# Patient Record
Sex: Female | Born: 1950 | Race: White | Hispanic: No | Marital: Married | Smoking: Never smoker
Health system: Southern US, Community
[De-identification: ages and names within clinical notes are randomized; demographics above are authoritative.]

## PROBLEM LIST (undated history)

## (undated) DIAGNOSIS — E039 Hypothyroidism, unspecified: Secondary | ICD-10-CM

## (undated) DIAGNOSIS — Z1239 Encounter for other screening for malignant neoplasm of breast: Secondary | ICD-10-CM

## (undated) DIAGNOSIS — K219 Gastro-esophageal reflux disease without esophagitis: Secondary | ICD-10-CM

## (undated) DIAGNOSIS — Z853 Personal history of malignant neoplasm of breast: Secondary | ICD-10-CM

## (undated) DIAGNOSIS — E349 Endocrine disorder, unspecified: Secondary | ICD-10-CM

## (undated) DIAGNOSIS — N3090 Cystitis, unspecified without hematuria: Secondary | ICD-10-CM

## (undated) DIAGNOSIS — E669 Obesity, unspecified: Secondary | ICD-10-CM

## (undated) DIAGNOSIS — Z1211 Encounter for screening for malignant neoplasm of colon: Secondary | ICD-10-CM

## (undated) DIAGNOSIS — C801 Malignant (primary) neoplasm, unspecified: Secondary | ICD-10-CM

## (undated) DIAGNOSIS — G620 Drug-induced polyneuropathy: Secondary | ICD-10-CM

## (undated) DIAGNOSIS — E119 Type 2 diabetes mellitus without complications: Secondary | ICD-10-CM

## (undated) DIAGNOSIS — D649 Anemia, unspecified: Secondary | ICD-10-CM

## (undated) HISTORY — DX: Obesity, unspecified: E66.9

## (undated) HISTORY — DX: Gastro-esophageal reflux disease without esophagitis: K21.9

## (undated) HISTORY — DX: Type 2 diabetes mellitus without complications: E11.9

## (undated) HISTORY — DX: Encounter for other screening for malignant neoplasm of breast: Z12.39

## (undated) HISTORY — DX: Personal history of malignant neoplasm of breast: Z85.3

## (undated) HISTORY — DX: Cystitis, unspecified without hematuria: N30.90

## (undated) HISTORY — DX: Drug-induced polyneuropathy: G62.0

## (undated) HISTORY — DX: Malignant (primary) neoplasm, unspecified: C80.1

## (undated) HISTORY — DX: Encounter for screening for malignant neoplasm of colon: Z12.11

## (undated) HISTORY — PX: PORTACATH PLACEMENT: SHX2246

## (undated) HISTORY — DX: Endocrine disorder, unspecified: E34.9

## (undated) HISTORY — PX: APPENDECTOMY: SHX54

---

## 1995-07-01 DIAGNOSIS — C801 Malignant (primary) neoplasm, unspecified: Secondary | ICD-10-CM

## 1995-07-01 HISTORY — PX: ABDOMINAL HYSTERECTOMY: SHX81

## 1995-07-01 HISTORY — DX: Malignant (primary) neoplasm, unspecified: C80.1

## 2004-06-30 DIAGNOSIS — K219 Gastro-esophageal reflux disease without esophagitis: Secondary | ICD-10-CM

## 2004-06-30 HISTORY — DX: Gastro-esophageal reflux disease without esophagitis: K21.9

## 2004-11-20 ENCOUNTER — Ambulatory Visit: Payer: Self-pay | Admitting: Unknown Physician Specialty

## 2005-12-22 ENCOUNTER — Ambulatory Visit: Payer: Self-pay | Admitting: Unknown Physician Specialty

## 2007-07-01 DIAGNOSIS — E349 Endocrine disorder, unspecified: Secondary | ICD-10-CM

## 2007-07-01 DIAGNOSIS — Z853 Personal history of malignant neoplasm of breast: Secondary | ICD-10-CM

## 2007-07-01 HISTORY — DX: Endocrine disorder, unspecified: E34.9

## 2007-07-01 HISTORY — DX: Personal history of malignant neoplasm of breast: Z85.3

## 2007-08-26 ENCOUNTER — Ambulatory Visit: Payer: Self-pay | Admitting: Unknown Physician Specialty

## 2007-08-29 ENCOUNTER — Ambulatory Visit: Payer: Self-pay | Admitting: Oncology

## 2007-09-14 ENCOUNTER — Ambulatory Visit: Payer: Self-pay | Admitting: General Surgery

## 2007-09-15 ENCOUNTER — Ambulatory Visit: Payer: Self-pay | Admitting: General Surgery

## 2007-09-22 ENCOUNTER — Ambulatory Visit: Payer: Self-pay | Admitting: Oncology

## 2007-09-29 ENCOUNTER — Ambulatory Visit: Payer: Self-pay | Admitting: Oncology

## 2007-10-01 ENCOUNTER — Ambulatory Visit: Payer: Self-pay | Admitting: General Surgery

## 2007-10-29 ENCOUNTER — Ambulatory Visit: Payer: Self-pay | Admitting: Oncology

## 2007-11-29 ENCOUNTER — Ambulatory Visit: Payer: Self-pay | Admitting: Oncology

## 2007-12-29 ENCOUNTER — Ambulatory Visit: Payer: Self-pay | Admitting: Oncology

## 2008-01-29 ENCOUNTER — Ambulatory Visit: Payer: Self-pay | Admitting: Oncology

## 2008-02-29 ENCOUNTER — Ambulatory Visit: Payer: Self-pay | Admitting: Radiation Oncology

## 2008-02-29 ENCOUNTER — Ambulatory Visit: Payer: Self-pay | Admitting: Oncology

## 2008-03-30 ENCOUNTER — Ambulatory Visit: Payer: Self-pay | Admitting: Oncology

## 2008-04-30 ENCOUNTER — Ambulatory Visit: Payer: Self-pay | Admitting: Oncology

## 2008-05-30 ENCOUNTER — Ambulatory Visit: Payer: Self-pay | Admitting: Oncology

## 2008-06-30 ENCOUNTER — Ambulatory Visit: Payer: Self-pay | Admitting: Oncology

## 2008-07-31 ENCOUNTER — Ambulatory Visit: Payer: Self-pay | Admitting: Oncology

## 2008-08-28 ENCOUNTER — Ambulatory Visit: Payer: Self-pay | Admitting: General Surgery

## 2008-08-28 ENCOUNTER — Ambulatory Visit: Payer: Self-pay | Admitting: Oncology

## 2008-09-28 ENCOUNTER — Ambulatory Visit: Payer: Self-pay | Admitting: Oncology

## 2008-10-04 ENCOUNTER — Ambulatory Visit: Payer: Self-pay | Admitting: Oncology

## 2008-10-28 ENCOUNTER — Ambulatory Visit: Payer: Self-pay | Admitting: Oncology

## 2008-11-28 ENCOUNTER — Ambulatory Visit: Payer: Self-pay | Admitting: Oncology

## 2009-01-05 ENCOUNTER — Ambulatory Visit: Payer: Self-pay | Admitting: Oncology

## 2009-01-28 ENCOUNTER — Ambulatory Visit: Payer: Self-pay | Admitting: Oncology

## 2009-02-28 ENCOUNTER — Ambulatory Visit: Payer: Self-pay | Admitting: Oncology

## 2009-03-30 ENCOUNTER — Ambulatory Visit: Payer: Self-pay | Admitting: Oncology

## 2009-04-12 ENCOUNTER — Ambulatory Visit: Payer: Self-pay | Admitting: Oncology

## 2009-04-30 ENCOUNTER — Ambulatory Visit: Payer: Self-pay | Admitting: Oncology

## 2009-05-30 ENCOUNTER — Ambulatory Visit: Payer: Self-pay | Admitting: Oncology

## 2009-06-30 ENCOUNTER — Ambulatory Visit: Payer: Self-pay | Admitting: Oncology

## 2009-06-30 HISTORY — PX: COLONOSCOPY: SHX174

## 2009-06-30 HISTORY — PX: UPPER GI ENDOSCOPY: SHX6162

## 2009-08-13 ENCOUNTER — Ambulatory Visit: Payer: Self-pay | Admitting: Oncology

## 2009-08-28 ENCOUNTER — Ambulatory Visit: Payer: Self-pay | Admitting: Oncology

## 2009-09-19 ENCOUNTER — Ambulatory Visit: Payer: Self-pay | Admitting: General Surgery

## 2009-10-01 ENCOUNTER — Ambulatory Visit: Payer: Self-pay | Admitting: Oncology

## 2009-10-12 ENCOUNTER — Ambulatory Visit: Payer: Self-pay | Admitting: Unknown Physician Specialty

## 2009-10-28 ENCOUNTER — Ambulatory Visit: Payer: Self-pay | Admitting: Oncology

## 2009-11-28 ENCOUNTER — Ambulatory Visit: Payer: Self-pay | Admitting: Oncology

## 2009-12-03 ENCOUNTER — Ambulatory Visit: Payer: Self-pay | Admitting: Oncology

## 2009-12-28 ENCOUNTER — Ambulatory Visit: Payer: Self-pay | Admitting: Oncology

## 2010-01-31 ENCOUNTER — Ambulatory Visit: Payer: Self-pay | Admitting: Oncology

## 2010-02-28 ENCOUNTER — Ambulatory Visit: Payer: Self-pay | Admitting: Oncology

## 2010-04-01 ENCOUNTER — Ambulatory Visit: Payer: Self-pay | Admitting: General Surgery

## 2010-04-25 ENCOUNTER — Ambulatory Visit: Payer: Self-pay | Admitting: Oncology

## 2010-04-30 ENCOUNTER — Ambulatory Visit: Payer: Self-pay | Admitting: Oncology

## 2010-06-30 HISTORY — PX: PORT-A-CATH REMOVAL: SHX5289

## 2010-07-04 ENCOUNTER — Ambulatory Visit: Payer: Self-pay | Admitting: Oncology

## 2010-07-05 LAB — CANCER ANTIGEN 27.29: CA 27.29: 29.8 U/mL (ref 0.0–38.6)

## 2010-07-31 ENCOUNTER — Ambulatory Visit: Payer: Self-pay | Admitting: Oncology

## 2010-09-12 ENCOUNTER — Ambulatory Visit: Payer: Self-pay | Admitting: Oncology

## 2010-09-29 ENCOUNTER — Ambulatory Visit: Payer: Self-pay | Admitting: Oncology

## 2010-10-08 ENCOUNTER — Ambulatory Visit: Payer: Self-pay | Admitting: General Surgery

## 2011-01-16 ENCOUNTER — Ambulatory Visit: Payer: Self-pay | Admitting: Oncology

## 2011-01-17 LAB — CANCER ANTIGEN 27.29: CA 27.29: 35.5 U/mL (ref 0.0–38.6)

## 2011-01-29 ENCOUNTER — Ambulatory Visit: Payer: Self-pay | Admitting: Oncology

## 2011-07-17 ENCOUNTER — Ambulatory Visit: Payer: Self-pay | Admitting: Oncology

## 2011-07-17 LAB — COMPREHENSIVE METABOLIC PANEL
Albumin: 4.2 g/dL (ref 3.4–5.0)
Alkaline Phosphatase: 77 U/L (ref 50–136)
BUN: 10 mg/dL (ref 7–18)
Bilirubin,Total: 0.8 mg/dL (ref 0.2–1.0)
Calcium, Total: 9.1 mg/dL (ref 8.5–10.1)
Co2: 28 mmol/L (ref 21–32)
Creatinine: 0.81 mg/dL (ref 0.60–1.30)
Glucose: 92 mg/dL (ref 65–99)
SGOT(AST): 25 U/L (ref 15–37)
Sodium: 144 mmol/L (ref 136–145)

## 2011-07-17 LAB — CBC CANCER CENTER
Basophil %: 0.5 %
Eosinophil #: 0.1 x10 3/mm (ref 0.0–0.7)
Eosinophil %: 1.3 %
HCT: 34.2 % — ABNORMAL LOW (ref 35.0–47.0)
HGB: 11.7 g/dL — ABNORMAL LOW (ref 12.0–16.0)
Lymphocyte #: 1.6 x10 3/mm (ref 1.0–3.6)
MCH: 31.1 pg (ref 26.0–34.0)
MCV: 91 fL (ref 80–100)
Monocyte #: 0.4 x10 3/mm (ref 0.0–0.7)
Monocyte %: 7.7 %
Platelet: 223 x10 3/mm (ref 150–440)
RBC: 3.77 10*6/uL — ABNORMAL LOW (ref 3.80–5.20)
WBC: 5.5 x10 3/mm (ref 3.6–11.0)

## 2011-08-01 ENCOUNTER — Ambulatory Visit: Payer: Self-pay | Admitting: Oncology

## 2011-10-13 ENCOUNTER — Ambulatory Visit: Payer: Self-pay | Admitting: General Surgery

## 2012-04-08 ENCOUNTER — Ambulatory Visit: Payer: Self-pay | Admitting: Oncology

## 2012-04-30 ENCOUNTER — Ambulatory Visit: Payer: Self-pay | Admitting: Oncology

## 2012-08-21 ENCOUNTER — Encounter: Payer: Self-pay | Admitting: General Surgery

## 2012-10-06 ENCOUNTER — Ambulatory Visit: Payer: Self-pay | Admitting: Oncology

## 2012-10-13 ENCOUNTER — Ambulatory Visit: Payer: Self-pay | Admitting: General Surgery

## 2012-10-18 ENCOUNTER — Encounter: Payer: Self-pay | Admitting: General Surgery

## 2012-10-28 ENCOUNTER — Ambulatory Visit: Payer: Self-pay | Admitting: Oncology

## 2012-11-08 ENCOUNTER — Ambulatory Visit: Payer: Self-pay | Admitting: General Surgery

## 2013-05-23 ENCOUNTER — Ambulatory Visit: Payer: Self-pay | Admitting: General Surgery

## 2013-06-16 ENCOUNTER — Encounter: Payer: Self-pay | Admitting: General Surgery

## 2013-07-04 ENCOUNTER — Ambulatory Visit (INDEPENDENT_AMBULATORY_CARE_PROVIDER_SITE_OTHER): Payer: 59 | Admitting: General Surgery

## 2013-07-04 ENCOUNTER — Encounter: Payer: Self-pay | Admitting: General Surgery

## 2013-07-04 VITALS — BP 140/70 | HR 78 | Resp 14 | Ht 65.0 in | Wt 185.0 lb

## 2013-07-04 DIAGNOSIS — Z1501 Genetic susceptibility to malignant neoplasm of breast: Secondary | ICD-10-CM

## 2013-07-04 DIAGNOSIS — Z853 Personal history of malignant neoplasm of breast: Secondary | ICD-10-CM

## 2013-07-04 DIAGNOSIS — Z1509 Genetic susceptibility to other malignant neoplasm: Secondary | ICD-10-CM

## 2013-07-04 NOTE — Patient Instructions (Signed)
Mastectomy, With or Without Reconstruction °Mastectomy (removal of the breast) is a procedure most commonly used to treat cancer (tumor) of the breast. Different procedures are available for treatment. This depends on the stage of the tumor (abnormal growths). Discuss this with your caregiver, surgeon (a specialist for performing operations such as this), or oncologist (someone specialized in the treatment of cancer). With proper information, you can decide which treatment is best for you. Although the sound of the word cancer is frightening to all of us, the new treatments and medications can be a source of reassurance and comfort. If there are things you are worried about, discuss them with your caregiver. He or she can help comfort you and your family. Some of the different procedures for treating breast cancer are: °· Radical (extensive) mastectomy. This is an operation used to remove the entire breast, the muscles under the breast, and all of the glands (lymph nodes) under the arm. With all of the new treatments available for cancer of the breast, this procedure has become less common. °· Modified radical mastectomy. This is a similar operation to the radical mastectomy described above. In the modified radical mastectomy, the muscles of the chest wall are not removed unless one of the lessor muscles is removed. One of the lessor muscles may be removed to allow better removal of the lymph nodes. The axillary lymph nodes are also removed. Rarely, during an axillary node dissection nerves to this area are damaged. Radiation therapy is then often used to the area following this surgery. °· A total mastectomy also known as a complete or simple mastectomy. It involves removal of only the breast. The lymph nodes and the muscles are left in place. °· In a lumpectomy, the lump is removed from the breast. This is the simplest form of surgical treatment. A sentinel lymph node biopsy may also be done. Additional treatment  may be required. °RISKS AND COMPLICATIONS °The main problems that follow removal of the breast include: °· Infection (germs start growing in the wound). This can usually be treated with antibiotics (medications that kill germs). °· Lymphedema. This means the arm on the side of the breast that was operated on swells because the lymph (tissue fluid) cannot follow the main channels back into the body. This only occurs when the lymph nodes have had to be removed under the arm. °· There may be some areas of numbness to the upper arm and around the incision (cut by the surgeon) in the breast. This happens because of the cutting of or damage to some of the nerves in the area. This is most often unavoidable. °· There may be difficulty moving the arm in a full range of motion (moving in all directions) following surgery. This usually improves with time following use and exercise. °· Recurrence of breast cancer may happen with the very best of surgery and follow up treatment. Sometimes small cancer cells that cannot be seen with the naked eye have already spread at the time of surgery. When this happens other treatment is available. This treatment may be radiation, medications or a combination of both. °RECONSTRUCTION °Reconstruction of the breast may be done immediately if there is not going to be post-operative radiation. This surgery is done for cosmetic (improve appearance) purposes to improve the physical appearance after the operation. This may be done in two ways: °· It can be done using a saline filled prosthetic (an artificial breast which is filled with salt water). Silicone breast implants are now   re-approved by the FDA and are being commonly used.  Reconstruction can be done using the body's own muscle/fat/skin. Your caregiver will discuss your options with you. Depending upon your needs or choice, together you will be able to determine which procedure is best for you. Document Released: 03/11/2001 Document  Revised: 03/10/2012 Document Reviewed: 11/02/2007 [facility] Patient Information 2014 [place].

## 2013-07-04 NOTE — Progress Notes (Addendum)
Patient ID: Morgan Flores, female   DOB: 03-20-1951, 63 y.o.   MRN: [id number]  Chief Complaint  Patient presents with  . Other    discuss mastectomy    HPI Morgan Flores is a 63 y.o. female referred here by Dr Cristino Martes due to posititve BRCA 1 testing. Patient is also due to have bilateral oophorectomy surgery and wanted to coordinate this with a bilateral mastectomy. Her last mammogram was 09/2012 Cat 2.    HPI  Past Medical History  Diagnosis Date  . Endocrine problem 2009    thyroid  . Personal history of malignant neoplasm of breast 2009    right breast  . Breast screening, unspecified   . Special screening for malignant neoplasms, colon   . Obesity, unspecified   . Hiatal hernia 2011  . Bladder infection   . Acid reflux 2006  . Cancer 1997    cervical    Past Surgical History  Procedure Laterality Date  . Portacath placement  2009  . Cesarean section  1983  . Abdominal hysterectomy  1997  . Port-a-cath removal  2012  . Colonoscopy  2011    Dr. Vira Agar  . Upper gi endoscopy  2011    Dr. Vira Agar  . Appendectomy    . Breast lumpectomy Right 2009    s/p right breast L/AD/R and chemo for cancer  . Breast biopsy Right 2009    Family History  Problem Relation Age of Onset  . Cancer Paternal Grandfather     cancer of the heel    Social History History  Substance Use Topics  . Smoking status: Never Smoker   . Smokeless tobacco: Never Used  . Alcohol Use: No    Allergies  Allergen Reactions  . Tape     Redness   . Sulfa Antibiotics Rash    Current Outpatient Prescriptions  Medication Sig Dispense Refill  . atorvastatin (LIPITOR) 10 MG tablet Take 10 mg by mouth daily.      . Cholecalciferol (VITAMIN D3) 1000 UNITS CAPS Take by mouth daily.      [name] esomeprazole (NEXIUM) 40 MG capsule Take 40 mg by mouth daily.      [name] levothyroxine (SYNTHROID, LEVOTHROID) 50 MCG tablet Take 50 mcg by mouth daily.      . Multiple Vitamin (MULTIVITAMIN) capsule Take 1  capsule by mouth daily.      . vitamin C (ASCORBIC ACID) 500 MG tablet Take 500 mg by mouth daily.       No current facility-administered medications for this visit.    Review of Systems Review of Systems  Constitutional: Negative.   Respiratory: Negative.   Cardiovascular: Negative.     Blood pressure 140/70, pulse 78, resp. rate 14, height '5\' 5"'  (1.651 m), weight 185 lb (83.915 kg).  Physical Exam Physical Exam  Not performed. Pt here for discussion  Data Reviewed Notes and [facility] report from Dr. Cristino Martes.  Assessment    Right breast cancer post L/AD/R/Chemo in 2009. She has BRCA1 pos      Plan    Pt has had hysterectomy. She wishes to have ovaries removed and would like bil mastectomies with reconstruction. I had a full discussion with pt regarding all of this. Mastectomy and oophorectomy are certainly advisable. Reconstruction- plastic surgery consult is essential in planning. To do all of the above in one sitting may not be advisable. Her right breast was radiate and therefore an implant is not recommended for this. To begin with she  needs plastic surgery input for surgical planning. Prior to any surgery she will need a mammogram and possibly other imaging to rule out any evidence of recurrence. Will arrange for Plastic surgery consult and also will also discuss with Dr. Oliva Bustard from oncology. Pt is agreeable.        Markeith Jue G 09/29/2013, 2:23 PM

## 2013-07-05 ENCOUNTER — Encounter: Payer: Self-pay | Admitting: General Surgery

## 2013-07-05 DIAGNOSIS — Z853 Personal history of malignant neoplasm of breast: Secondary | ICD-10-CM | POA: Insufficient documentation

## 2013-07-05 DIAGNOSIS — Z15068 Genetic susceptibility to other malignant neoplasm of digestive system: Secondary | ICD-10-CM | POA: Insufficient documentation

## 2013-07-05 DIAGNOSIS — Z1509 Genetic susceptibility to other malignant neoplasm: Secondary | ICD-10-CM

## 2013-07-05 DIAGNOSIS — Z1501 Genetic susceptibility to malignant neoplasm of breast: Secondary | ICD-10-CM | POA: Insufficient documentation

## 2013-07-07 ENCOUNTER — Telehealth: Payer: Self-pay | Admitting: *Deleted

## 2013-07-07 NOTE — Telephone Encounter (Signed)
Patient was contacted today to see if Dr. Edwin Dada office has contacted her with an appointment date and time. This patient states this has not been scheduled at the present. She was instructed to call their office tomorrow mid-morning if she still has not heard anything back from them. Patient also aware if she has any trouble getting an appointment to give our office a call.   Records were faxed to Dr. Edwin Dada office for their review on 07-06-13 and received per J.J.  Patient instructed to call the office once a date has been arranged so we can inform Dr. Jamal Collin. She verbalizes understanding.

## 2013-07-28 ENCOUNTER — Telehealth: Payer: Self-pay | Admitting: *Deleted

## 2013-07-28 NOTE — Telephone Encounter (Signed)
Patient did not answer at work or cell number. No message left.   Dr. Edwin Dada office was contacted to see if she had already been seen for consultation or if she has an upcoming appointment. April at Dr. Edwin Dada office states the patient will be coming in for her consultation visit on 08-10-13 at 3:30 pm.

## 2013-09-01 ENCOUNTER — Telehealth: Payer: Self-pay | Admitting: *Deleted

## 2013-09-01 NOTE — Telephone Encounter (Signed)
Message left for patient and/or patient's husband to call the office.   We need to arrange for a pre-op visit prior to scheduling surgery per Dr. Jamal Collin.

## 2013-09-02 NOTE — Telephone Encounter (Signed)
Morgan Flores has arranged for patient to come in for pre-op discussion on Thursday, 09-08-13.

## 2013-09-08 ENCOUNTER — Ambulatory Visit (INDEPENDENT_AMBULATORY_CARE_PROVIDER_SITE_OTHER): Payer: 59 | Admitting: General Surgery

## 2013-09-08 ENCOUNTER — Other Ambulatory Visit: Payer: 59

## 2013-09-08 ENCOUNTER — Encounter: Payer: Self-pay | Admitting: General Surgery

## 2013-09-08 VITALS — BP 120/80 | HR 84 | Resp 12 | Ht 65.5 in | Wt 184.0 lb

## 2013-09-08 DIAGNOSIS — Z1509 Genetic susceptibility to other malignant neoplasm: Secondary | ICD-10-CM

## 2013-09-08 DIAGNOSIS — N63 Unspecified lump in unspecified breast: Secondary | ICD-10-CM

## 2013-09-08 DIAGNOSIS — Z853 Personal history of malignant neoplasm of breast: Secondary | ICD-10-CM

## 2013-09-08 DIAGNOSIS — Z1501 Genetic susceptibility to malignant neoplasm of breast: Secondary | ICD-10-CM

## 2013-09-08 NOTE — Patient Instructions (Addendum)
Return tomorrow am for core biopsy left breast mass. The patient is aware to call back for any questions or concerns.

## 2013-09-08 NOTE — Progress Notes (Addendum)
Patient ID: Morgan Flores, female   DOB: May 26, 1951, 63 y.o.   MRN: [id number]  Chief Complaint  Patient presents with  . Follow-up    Pre-Op bilateral mastectomy     HPI Morgan Flores is a 63 y.o. female who presents for a pre-op evaluation. She is 6 yrs post right breast CA treatment. Recently noted tbe BRCA 1 pos. Pt had consultation with Dr. Tula Nakayama regarding breast reconstruction-felt this can be done after bil mastectomy completed.   The patient denies any new problems at this time. Her last mammogram was 30yrago.  HPI  Past Medical History  Diagnosis Date  . Endocrine problem 2009    thyroid  . Personal history of malignant neoplasm of breast 2009    right breast  . Breast screening, unspecified   . Special screening for malignant neoplasms, colon   . Obesity, unspecified   . Hiatal hernia 2011  . Bladder infection   . Acid reflux 2006  . Cancer 1997    cervical    Past Surgical History  Procedure Laterality Date  . Portacath placement  2009  . Cesarean section  1983  . Abdominal hysterectomy  1997  . Port-a-cath removal  2012  . Colonoscopy  2011    Dr. EVira Agar . Upper gi endoscopy  2011    Dr. EVira Agar . Appendectomy    . Breast lumpectomy Right 2009    s/p right breast L/AD/R and chemo for cancer  . Breast biopsy Right 2009    Family History  Problem Relation Age of Onset  . Cancer Paternal Grandfather     cancer of the heel    Social History History  Substance Use Topics  . Smoking status: Never Smoker   . Smokeless tobacco: Never Used  . Alcohol Use: No    Allergies  Allergen Reactions  . Tape     Redness   . Sulfa Antibiotics Rash    Current Outpatient Prescriptions  Medication Sig Dispense Refill  . atorvastatin (LIPITOR) 10 MG tablet Take 10 mg by mouth daily.      . Cholecalciferol (VITAMIN D3) 1000 UNITS CAPS Take by mouth daily.      .Marland Kitchenesomeprazole (NEXIUM) 40 MG capsule Take 40 mg by mouth daily.      .Marland Kitchenlevothyroxine  (SYNTHROID, LEVOTHROID) 50 MCG tablet Take 50 mcg by mouth daily.      . Multiple Vitamin (MULTIVITAMIN) capsule Take 1 capsule by mouth daily.      . vitamin C (ASCORBIC ACID) 500 MG tablet Take 500 mg by mouth daily.       No current facility-administered medications for this visit.    Review of Systems Review of Systems  Constitutional: Negative.   Respiratory: Negative.   Cardiovascular: Negative.     Blood pressure 120/80, pulse 84, resp. rate 12, height 5' 5.5" (1.664 m), weight 184 lb (83.462 kg).  Physical Exam Physical Exam  Constitutional: She is oriented to person, place, and time. She appears well-developed and well-nourished.  Eyes: Conjunctivae are normal. No scleral icterus.  Neck: Neck supple. No thyromegaly present.  Cardiovascular: Normal rate, regular rhythm and normal heart sounds.   No murmur heard. Pulses:      Dorsalis pedis pulses are 2+ on the right side, and 2+ on the left side.       Posterior tibial pulses are 2+ on the right side, and 2+ on the left side.  No edema present  Pulmonary/Chest: Effort normal and  breath sounds normal. Right breast exhibits no inverted nipple, no mass, no nipple discharge, no skin change and no tenderness. Left breast exhibits no inverted nipple, no mass, no nipple discharge, no skin change and no tenderness.  Ill defined 2 cm thickening at 1 o'clock of left breast.   Lymphadenopathy:    She has no cervical adenopathy.    She has no axillary adenopathy.  Neurological: She is alert and oriented to person, place, and time.  Skin: Skin is warm and dry.    Data Reviewed Ultrasound done today shows ill defined mass or group of masses in area of palpable finding of left breast.   Assessment    History of right breast cancer. Recent finding of BRCA 1 positive. New finding in left breast.     Plan    Core biopsy of left breast mass first, mammogram after, proceed from there.        SANKAR,SEEPLAPUTHUR G 09/29/2013,  2:24 PM

## 2013-09-09 ENCOUNTER — Encounter: Payer: Self-pay | Admitting: General Surgery

## 2013-09-09 ENCOUNTER — Other Ambulatory Visit: Payer: Self-pay | Admitting: General Surgery

## 2013-09-09 ENCOUNTER — Ambulatory Visit (INDEPENDENT_AMBULATORY_CARE_PROVIDER_SITE_OTHER): Payer: 59 | Admitting: General Surgery

## 2013-09-09 ENCOUNTER — Other Ambulatory Visit: Payer: 59

## 2013-09-09 VITALS — BP 118/78 | HR 72 | Resp 14 | Ht 65.0 in | Wt 184.0 lb

## 2013-09-09 DIAGNOSIS — N63 Unspecified lump in unspecified breast: Secondary | ICD-10-CM | POA: Insufficient documentation

## 2013-09-09 NOTE — Progress Notes (Addendum)
Patient ID: Morgan Flores, female   DOB: March 09, 1951, 63 y.o.   MRN: [id number]  Chief Complaint  Patient presents with  . Procedure    left breast biopsy    HPI Morgan Flores is a 63 y.o. female here today for a left breast biopsy.  HPI  Past Medical History  Diagnosis Date  . Endocrine problem 2009    thyroid  . Personal history of malignant neoplasm of breast 2009    right breast  . Breast screening, unspecified   . Special screening for malignant neoplasms, colon   . Obesity, unspecified   . Hiatal hernia 2011  . Bladder infection   . Acid reflux 2006  . Cancer 1997    cervical    Past Surgical History  Procedure Laterality Date  . Portacath placement  2009  . Cesarean section  1983  . Abdominal hysterectomy  1997  . Port-a-cath removal  2012  . Colonoscopy  2011    Dr. Vira Agar  . Upper gi endoscopy  2011    Dr. Vira Agar  . Appendectomy    . Breast lumpectomy Right 2009    s/p right breast L/AD/R and chemo for cancer  . Breast biopsy Right 2009    Family History  Problem Relation Age of Onset  . Cancer Paternal Grandfather     cancer of the heel    Social History History  Substance Use Topics  . Smoking status: Never Smoker   . Smokeless tobacco: Never Used  . Alcohol Use: No    Allergies  Allergen Reactions  . Tape     Redness   . Sulfa Antibiotics Rash    Current Outpatient Prescriptions  Medication Sig Dispense Refill  . atorvastatin (LIPITOR) 10 MG tablet Take 10 mg by mouth daily.      . Cholecalciferol (VITAMIN D3) 1000 UNITS CAPS Take by mouth daily.      [name] esomeprazole (NEXIUM) 40 MG capsule Take 40 mg by mouth daily.      [name] levothyroxine (SYNTHROID, LEVOTHROID) 50 MCG tablet Take 50 mcg by mouth daily.      . Multiple Vitamin (MULTIVITAMIN) capsule Take 1 capsule by mouth daily.      . vitamin C (ASCORBIC ACID) 500 MG tablet Take 500 mg by mouth daily.       No current facility-administered medications for this visit.     Review of Systems Review of Systems  Constitutional: Negative.   Respiratory: Negative.   Cardiovascular: Negative.     Blood pressure 118/78, pulse 72, resp. rate 14, height 5\' 5"  (1.651 m), weight 184 lb (83.462 kg).  Physical Exam Physical Exam Mass in left breast uoq at 1 ocl was again confirmed Data Reviewed    Assessment      New left breast mass    Plan      Core biopsy completed today.       Aaden Buckman G 09/29/2013, 2:25 PM

## 2013-09-09 NOTE — Patient Instructions (Signed)

## 2013-09-13 ENCOUNTER — Telehealth: Payer: Self-pay | Admitting: *Deleted

## 2013-09-13 NOTE — Telephone Encounter (Signed)
Patient called back and notified as instructed. We will be in touch with patient once Dr. Jamal Collin has spoken to Dr. Cristino Martes.

## 2013-09-13 NOTE — Telephone Encounter (Signed)
Message left on home, work, and cell numbers for patient to call the office.  Dr. Jamal Collin wanted [place] to notify patient that he has a call out to Dr. Cristino Martes and we are waiting on her to call the office back before we arrange surgery.

## 2013-09-13 NOTE — Telephone Encounter (Signed)
Message copied by Dominga Ferry on Tue Sep 13, 2013 10:10 AM ------      Message from: Christene Lye      Created: Mon Sep 12, 2013  6:50 PM       Pt advised by phone on this report. Do not feel she needs a mammogram. Plan for bilateral total mastectomy and left SN biopsy. Oophorectomy at same time by Dr. Cristino Martes.       Pt is agreeable with this plan.[name] ER/PR and her 2 are being processed and may along with SN status will determine if she needs any adjuvant chemo. ------

## 2013-09-14 ENCOUNTER — Telehealth: Payer: Self-pay | Admitting: *Deleted

## 2013-09-14 NOTE — Telephone Encounter (Signed)
Patient's surgery has been scheduled for 10-05-13 at [facility].Dr. Cristino Martes will also be doing surgery on this patient the same day. This patient was instructed to call the office should she have further questions.

## 2013-09-15 LAB — PATHOLOGY

## 2013-09-20 ENCOUNTER — Other Ambulatory Visit: Payer: Self-pay | Admitting: General Surgery

## 2013-09-20 ENCOUNTER — Other Ambulatory Visit: Payer: Self-pay | Admitting: *Deleted

## 2013-09-20 DIAGNOSIS — C50419 Malignant neoplasm of upper-outer quadrant of unspecified female breast: Secondary | ICD-10-CM

## 2013-09-20 DIAGNOSIS — C50919 Malignant neoplasm of unspecified site of unspecified female breast: Secondary | ICD-10-CM

## 2013-09-20 DIAGNOSIS — Z1501 Genetic susceptibility to malignant neoplasm of breast: Secondary | ICD-10-CM

## 2013-09-20 DIAGNOSIS — Z1509 Genetic susceptibility to other malignant neoplasm: Principal | ICD-10-CM

## 2013-09-20 NOTE — Progress Notes (Signed)
Patient to have labs drawn through [facility] at the location of her choice per patient request. The following labs have been ordered: CBC, Met C, and CA 27-29.

## 2013-09-22 LAB — CBC WITH DIFFERENTIAL
BASOS: 0 %
Basophils Absolute: 0 10*3/uL (ref 0.0–0.2)
EOS: 3 %
Eosinophils Absolute: 0.1 10*3/uL (ref 0.0–0.4)
HCT: 35.4 % (ref 34.0–46.6)
HEMOGLOBIN: 11.9 g/dL (ref 11.1–15.9)
IMMATURE GRANS (ABS): 0 10*3/uL (ref 0.0–0.1)
Immature Granulocytes: 0 %
LYMPHS: 32 %
Lymphocytes Absolute: 1.5 10*3/uL (ref 0.7–3.1)
MCH: 30.6 pg (ref 26.6–33.0)
MCHC: 33.6 g/dL (ref 31.5–35.7)
MCV: 91 fL (ref 79–97)
MONOCYTES: 6 %
Monocytes Absolute: 0.3 10*3/uL (ref 0.1–0.9)
NEUTROS PCT: 59 %
Neutrophils Absolute: 2.9 10*3/uL (ref 1.4–7.0)
Platelets: 260 10*3/uL (ref 150–379)
RBC: 3.89 x10E6/uL (ref 3.77–5.28)
RDW: 13.8 % (ref 12.3–15.4)
WBC: 4.9 10*3/uL (ref 3.4–10.8)

## 2013-09-22 LAB — COMPREHENSIVE METABOLIC PANEL
A/G RATIO: 2.4 (ref 1.1–2.5)
ALT: 13 IU/L (ref 0–32)
AST: 13 IU/L (ref 0–40)
Albumin: 4.6 g/dL (ref 3.6–4.8)
Alkaline Phosphatase: 78 IU/L (ref 39–117)
BUN/Creatinine Ratio: 15 (ref 11–26)
BUN: 13 mg/dL (ref 8–27)
CO2: 25 mmol/L (ref 18–29)
CREATININE: 0.85 mg/dL (ref 0.57–1.00)
Calcium: 9.6 mg/dL (ref 8.7–10.3)
Chloride: 103 mmol/L (ref 97–108)
GFR calc Af Amer: 86 mL/min/{1.73_m2} (ref >59)
GFR, EST NON AFRICAN AMERICAN: 74 mL/min/{1.73_m2} (ref >59)
GLOBULIN, TOTAL: 1.9 g/dL (ref 1.5–4.5)
GLUCOSE: 143 mg/dL — AB (ref 65–99)
POTASSIUM: 3.8 mmol/L (ref 3.5–5.2)
Sodium: 142 mmol/L (ref 134–144)
TOTAL PROTEIN: 6.5 g/dL (ref 6.0–8.5)
Total Bilirubin: 0.8 mg/dL (ref 0.0–1.2)

## 2013-09-22 LAB — CANCER ANTIGEN 27.29: CA 27.29: 25.9 U/mL (ref 0.0–38.6)

## 2013-09-29 ENCOUNTER — Telehealth: Payer: Self-pay | Admitting: *Deleted

## 2013-09-29 ENCOUNTER — Encounter: Payer: Self-pay | Admitting: *Deleted

## 2013-09-29 ENCOUNTER — Ambulatory Visit: Payer: Self-pay | Admitting: General Surgery

## 2013-09-29 NOTE — Telephone Encounter (Signed)
FMLA papers completed. 

## 2013-10-03 ENCOUNTER — Encounter: Payer: Self-pay | Admitting: General Surgery

## 2013-10-05 ENCOUNTER — Ambulatory Visit: Payer: Self-pay | Admitting: General Surgery

## 2013-10-05 ENCOUNTER — Encounter: Payer: Self-pay | Admitting: General Surgery

## 2013-10-05 DIAGNOSIS — C50419 Malignant neoplasm of upper-outer quadrant of unspecified female breast: Secondary | ICD-10-CM

## 2013-10-06 ENCOUNTER — Ambulatory Visit: Payer: Self-pay | Admitting: Oncology

## 2013-10-06 HISTORY — PX: BREAST SURGERY: SHX581

## 2013-10-06 LAB — BASIC METABOLIC PANEL
ANION GAP: 2 — AB (ref 7–16)
BUN: 7 mg/dL (ref 7–18)
CO2: 30 mmol/L (ref 21–32)
Calcium, Total: 8.1 mg/dL — ABNORMAL LOW (ref 8.5–10.1)
Chloride: 107 mmol/L (ref 98–107)
Creatinine: 0.86 mg/dL (ref 0.60–1.30)
GLUCOSE: 127 mg/dL — AB (ref 65–99)
Osmolality: 277 (ref 275–301)
POTASSIUM: 4.2 mmol/L (ref 3.5–5.1)
SODIUM: 139 mmol/L (ref 136–145)

## 2013-10-06 LAB — PATHOLOGY REPORT

## 2013-10-06 LAB — PLATELET COUNT: Platelet: 176 10*3/uL (ref 150–440)

## 2013-10-10 ENCOUNTER — Encounter: Payer: Self-pay | Admitting: General Surgery

## 2013-10-12 ENCOUNTER — Encounter: Payer: Self-pay | Admitting: General Surgery

## 2013-10-12 ENCOUNTER — Ambulatory Visit (INDEPENDENT_AMBULATORY_CARE_PROVIDER_SITE_OTHER): Payer: 59 | Admitting: General Surgery

## 2013-10-12 VITALS — BP 122/78 | HR 84 | Resp 14 | Ht 65.0 in | Wt 175.0 lb

## 2013-10-12 DIAGNOSIS — C50419 Malignant neoplasm of upper-outer quadrant of unspecified female breast: Secondary | ICD-10-CM

## 2013-10-12 NOTE — Progress Notes (Signed)
Patient ID: Morgan Flores, female   DOB: 01/01/1951, 63 y.o.   MRN: [id number]  The patient presents for a post op bilateral mastectomy visit. The patient denies any new problems at this time. She is doing well. Attempted oophorectomy was unsuccessful, ovaries could not be located. In the course of exploration bladder tear encountered and repaired. Pt has f/u with Dr. Jacqlyn Larsen in 2 days for cystogram. Bilateal mastectomy site looks clean and healing well.  Drainage is been less than  30 ml for three day but noticed clots in tubes . Clots were milked out. Patient to return on Friday. Path report- primary 2.3cm size. 2 SN negative.Will forward report to oncology-Dr. Oliva Bustard.

## 2013-10-13 ENCOUNTER — Encounter: Payer: Self-pay | Admitting: General Surgery

## 2013-10-13 NOTE — Patient Instructions (Signed)
Monitor drainage. Will recheck in 2 days.

## 2013-10-14 ENCOUNTER — Encounter: Payer: Self-pay | Admitting: General Surgery

## 2013-10-14 ENCOUNTER — Ambulatory Visit: Payer: Self-pay | Admitting: Urology

## 2013-10-14 ENCOUNTER — Ambulatory Visit (INDEPENDENT_AMBULATORY_CARE_PROVIDER_SITE_OTHER): Payer: 59 | Admitting: General Surgery

## 2013-10-14 VITALS — BP 120/70 | HR 76 | Resp 14 | Ht 65.0 in | Wt 174.0 lb

## 2013-10-14 DIAGNOSIS — C50919 Malignant neoplasm of unspecified site of unspecified female breast: Secondary | ICD-10-CM | POA: Insufficient documentation

## 2013-10-14 DIAGNOSIS — C50419 Malignant neoplasm of upper-outer quadrant of unspecified female breast: Secondary | ICD-10-CM

## 2013-10-14 DIAGNOSIS — T819XXA Unspecified complication of procedure, initial encounter: Secondary | ICD-10-CM | POA: Insufficient documentation

## 2013-10-14 DIAGNOSIS — N393 Stress incontinence (female) (male): Secondary | ICD-10-CM | POA: Insufficient documentation

## 2013-10-14 NOTE — Patient Instructions (Signed)
Patient to return in one week. 

## 2013-10-14 NOTE — Progress Notes (Signed)
This is a 63 year old female here today for her post op bilateral mastectomy visit. The patient denies any new problems at this time. She is doing well. Attempted oophorectomy was unsuccessful, ovaries could not be located.  In the course of exploration bladder tear encountered and repaired. Drainage from chest incisions are less than 7ml/day for last 3-4 days. Incisions are clean. No seroma noted Bilateral drains removed. Pt advised on pathology-primary tumor was 2.3cm, SN negative. Mammoprint is being processed per Dr. Oliva Bustard.

## 2013-10-15 ENCOUNTER — Encounter: Payer: Self-pay | Admitting: General Surgery

## 2013-10-15 DIAGNOSIS — C50419 Malignant neoplasm of upper-outer quadrant of unspecified female breast: Secondary | ICD-10-CM | POA: Insufficient documentation

## 2013-10-25 ENCOUNTER — Encounter: Payer: Self-pay | Admitting: General Surgery

## 2013-10-25 ENCOUNTER — Ambulatory Visit (INDEPENDENT_AMBULATORY_CARE_PROVIDER_SITE_OTHER): Payer: 59 | Admitting: General Surgery

## 2013-10-25 VITALS — BP 120/76 | HR 88 | Resp 12 | Ht 65.0 in | Wt 172.0 lb

## 2013-10-25 DIAGNOSIS — Z1509 Genetic susceptibility to other malignant neoplasm: Secondary | ICD-10-CM

## 2013-10-25 DIAGNOSIS — Z853 Personal history of malignant neoplasm of breast: Secondary | ICD-10-CM

## 2013-10-25 DIAGNOSIS — C50419 Malignant neoplasm of upper-outer quadrant of unspecified female breast: Secondary | ICD-10-CM

## 2013-10-25 DIAGNOSIS — Z1501 Genetic susceptibility to malignant neoplasm of breast: Secondary | ICD-10-CM

## 2013-10-25 NOTE — Patient Instructions (Signed)
Patient to return in 2 weeks for follow up visit. The patient is aware to call back for any questions or concerns.

## 2013-10-25 NOTE — Progress Notes (Signed)
Patient ID: Morgan Flores, female   DOB: 10-Feb-1951, 63 y.o.   MRN: [id number]  The patient presents for a 1 week post op bilateral mastectomy. The procedure was performed on 10/05/13. The patient is doing well at this time. Drains removed last week.  Well healing bilateral mastectomy sites. No fluid present.   mammoprint has been ordered. Report pending.  Recheck in 2 weeks. Rx given for mastectomy bras and prosthesis.

## 2013-10-26 ENCOUNTER — Encounter: Payer: Self-pay | Admitting: General Surgery

## 2013-10-28 ENCOUNTER — Ambulatory Visit: Payer: Self-pay | Admitting: Oncology

## 2013-11-07 DIAGNOSIS — R339 Retention of urine, unspecified: Secondary | ICD-10-CM | POA: Insufficient documentation

## 2013-11-08 ENCOUNTER — Telehealth: Payer: Self-pay | Admitting: *Deleted

## 2013-11-08 ENCOUNTER — Ambulatory Visit (INDEPENDENT_AMBULATORY_CARE_PROVIDER_SITE_OTHER): Payer: 59 | Admitting: General Surgery

## 2013-11-08 ENCOUNTER — Encounter: Payer: Self-pay | Admitting: General Surgery

## 2013-11-08 VITALS — BP 132/74 | HR 82 | Resp 14 | Ht 65.0 in | Wt 173.0 lb

## 2013-11-08 DIAGNOSIS — C50419 Malignant neoplasm of upper-outer quadrant of unspecified female breast: Secondary | ICD-10-CM

## 2013-11-08 NOTE — Patient Instructions (Signed)
The patient is aware to call back for any questions or concerns.  

## 2013-11-08 NOTE — Telephone Encounter (Signed)
Pt just wanted to let you know that Dr Cristino Martes at [facility] is on vacation till Monday.

## 2013-11-08 NOTE — Progress Notes (Signed)
The patient presents for a 2 week post op bilateral mastectomy. The procedure was performed on 10/05/13. The patient is doing well at this time.  Mastectomy sites are clean with no signs of infection or fluid collection.   Discussed further plans with Dr. Oliva Bustard. Mammoprint reports high risk status. Patient is to discuss this with him on Thursday.

## 2013-11-10 ENCOUNTER — Telehealth: Payer: Self-pay

## 2013-11-10 ENCOUNTER — Ambulatory Visit: Payer: Self-pay | Admitting: Oncology

## 2013-11-10 NOTE — Telephone Encounter (Signed)
Message for patient to call back to schedule port placement and to let her know that she does not need to see Dr Jamal Collin prior to this.

## 2013-11-11 ENCOUNTER — Telehealth: Payer: Self-pay

## 2013-11-11 NOTE — Telephone Encounter (Signed)
Spoke with patient about proposed date for port placement and patient is amendable to this. Patient is scheduled for surgery at [facility] on 11/15/13. She will pre admit by phone on 11/14/13. Patient is aware of date and all instructions.

## 2013-11-14 ENCOUNTER — Ambulatory Visit: Payer: 59 | Admitting: General Surgery

## 2013-11-14 ENCOUNTER — Other Ambulatory Visit: Payer: Self-pay | Admitting: General Surgery

## 2013-11-14 DIAGNOSIS — C50419 Malignant neoplasm of upper-outer quadrant of unspecified female breast: Secondary | ICD-10-CM

## 2013-11-15 ENCOUNTER — Encounter: Payer: Self-pay | Admitting: General Surgery

## 2013-11-15 ENCOUNTER — Ambulatory Visit: Payer: Self-pay | Admitting: General Surgery

## 2013-11-15 DIAGNOSIS — C50419 Malignant neoplasm of upper-outer quadrant of unspecified female breast: Secondary | ICD-10-CM

## 2013-11-16 ENCOUNTER — Encounter: Payer: Self-pay | Admitting: General Surgery

## 2013-11-17 LAB — CBC CANCER CENTER
BASOS ABS: 0 x10 3/mm (ref 0.0–0.1)
BASOS PCT: 0.4 %
Eosinophil #: 0 x10 3/mm (ref 0.0–0.7)
Eosinophil %: 0.1 %
HCT: 35.1 % (ref 35.0–47.0)
HGB: 12.1 g/dL (ref 12.0–16.0)
LYMPHS PCT: 8.6 %
Lymphocyte #: 0.7 x10 3/mm — ABNORMAL LOW (ref 1.0–3.6)
MCH: 30.1 pg (ref 26.0–34.0)
MCHC: 34.3 g/dL (ref 32.0–36.0)
MCV: 88 fL (ref 80–100)
MONOS PCT: 1 %
Monocyte #: 0.1 x10 3/mm — ABNORMAL LOW (ref 0.2–0.9)
Neutrophil #: 7.2 x10 3/mm — ABNORMAL HIGH (ref 1.4–6.5)
Neutrophil %: 89.9 %
Platelet: 236 x10 3/mm (ref 150–440)
RBC: 4.01 10*6/uL (ref 3.80–5.20)
RDW: 13.8 % (ref 11.5–14.5)
WBC: 8 x10 3/mm (ref 3.6–11.0)

## 2013-11-17 LAB — COMPREHENSIVE METABOLIC PANEL
ANION GAP: 14 (ref 7–16)
Albumin: 4.5 g/dL (ref 3.4–5.0)
Alkaline Phosphatase: 85 U/L
BUN: 16 mg/dL (ref 7–18)
Bilirubin,Total: 0.9 mg/dL (ref 0.2–1.0)
CO2: 24 mmol/L (ref 21–32)
CREATININE: 0.92 mg/dL (ref 0.60–1.30)
Calcium, Total: 9.5 mg/dL (ref 8.5–10.1)
Chloride: 102 mmol/L (ref 98–107)
EGFR (African American): >60
Glucose: 174 mg/dL — ABNORMAL HIGH (ref 65–99)
Osmolality: 285 (ref 275–301)
Potassium: 3.9 mmol/L (ref 3.5–5.1)
SGOT(AST): 10 U/L — ABNORMAL LOW (ref 15–37)
SGPT (ALT): 14 U/L (ref 12–78)
Sodium: 140 mmol/L (ref 136–145)
Total Protein: 8 g/dL (ref 6.4–8.2)

## 2013-11-28 ENCOUNTER — Ambulatory Visit: Payer: Self-pay | Admitting: Oncology

## 2013-11-30 ENCOUNTER — Ambulatory Visit (INDEPENDENT_AMBULATORY_CARE_PROVIDER_SITE_OTHER): Payer: 59 | Admitting: General Surgery

## 2013-11-30 ENCOUNTER — Encounter: Payer: Self-pay | Admitting: General Surgery

## 2013-11-30 VITALS — BP 108/58 | HR 70 | Resp 12 | Ht 65.0 in | Wt 162.0 lb

## 2013-11-30 DIAGNOSIS — Z1501 Genetic susceptibility to malignant neoplasm of breast: Secondary | ICD-10-CM

## 2013-11-30 DIAGNOSIS — C50419 Malignant neoplasm of upper-outer quadrant of unspecified female breast: Secondary | ICD-10-CM

## 2013-11-30 DIAGNOSIS — Z1509 Genetic susceptibility to other malignant neoplasm: Secondary | ICD-10-CM

## 2013-11-30 NOTE — Progress Notes (Signed)
This is a 63 year old female here today for her follow up port placement done 11/15/13. Patient started her chemo on 11/17/13. Patient states she is doing well.  Port site looks clean and healing well.Bilateral mastectomy sites well healed-no seroma noted. Lungs are clear.  Pt to continue with chemo. Recheck in 6 mos.

## 2013-11-30 NOTE — Patient Instructions (Addendum)
Patient may return to work on 11/28/13. Patient to return in 6 months.

## 2013-12-08 LAB — CBC CANCER CENTER
BASOS PCT: 0.4 %
Basophil #: 0 x10 3/mm (ref 0.0–0.1)
Eosinophil #: 0 x10 3/mm (ref 0.0–0.7)
Eosinophil %: 0 %
HCT: 32.6 % — AB (ref 35.0–47.0)
HGB: 10.9 g/dL — ABNORMAL LOW (ref 12.0–16.0)
Lymphocyte #: 0.8 x10 3/mm — ABNORMAL LOW (ref 1.0–3.6)
Lymphocyte %: 9.1 %
MCH: 30.2 pg (ref 26.0–34.0)
MCHC: 33.4 g/dL (ref 32.0–36.0)
MCV: 91 fL (ref 80–100)
MONO ABS: 0.1 x10 3/mm — AB (ref 0.2–0.9)
Monocyte %: 0.8 %
NEUTROS ABS: 7.4 x10 3/mm — AB (ref 1.4–6.5)
NEUTROS PCT: 89.7 %
Platelet: 189 x10 3/mm (ref 150–440)
RBC: 3.6 10*6/uL — ABNORMAL LOW (ref 3.80–5.20)
RDW: 14 % (ref 11.5–14.5)
WBC: 8.3 x10 3/mm (ref 3.6–11.0)

## 2013-12-08 LAB — COMPREHENSIVE METABOLIC PANEL
ALK PHOS: 80 U/L
Albumin: 3.9 g/dL (ref 3.4–5.0)
Anion Gap: 13 (ref 7–16)
BILIRUBIN TOTAL: 0.4 mg/dL (ref 0.2–1.0)
BUN: 19 mg/dL — AB (ref 7–18)
Calcium, Total: 9.9 mg/dL (ref 8.5–10.1)
Chloride: 103 mmol/L (ref 98–107)
Co2: 24 mmol/L (ref 21–32)
Creatinine: 1.09 mg/dL (ref 0.60–1.30)
EGFR (African American): >60
GFR CALC NON AF AMER: 55 — AB
GLUCOSE: 196 mg/dL — AB (ref 65–99)
Osmolality: 287 (ref 275–301)
Potassium: 3.9 mmol/L (ref 3.5–5.1)
SGOT(AST): 8 U/L — ABNORMAL LOW (ref 15–37)
SGPT (ALT): 21 U/L (ref 12–78)
SODIUM: 140 mmol/L (ref 136–145)
Total Protein: 7.5 g/dL (ref 6.4–8.2)

## 2013-12-28 ENCOUNTER — Ambulatory Visit: Payer: Self-pay | Admitting: Oncology

## 2013-12-29 LAB — CBC CANCER CENTER
BASOS ABS: 0.1 x10 3/mm (ref 0.0–0.1)
BASOS PCT: 1.2 %
Eosinophil #: 0 x10 3/mm (ref 0.0–0.7)
Eosinophil %: 0.1 %
HCT: 30.5 % — ABNORMAL LOW (ref 35.0–47.0)
HGB: 10.3 g/dL — ABNORMAL LOW (ref 12.0–16.0)
Lymphocyte #: 0.7 x10 3/mm — ABNORMAL LOW (ref 1.0–3.6)
Lymphocyte %: 13.5 %
MCH: 30 pg (ref 26.0–34.0)
MCHC: 33.6 g/dL (ref 32.0–36.0)
MCV: 89 fL (ref 80–100)
MONOS PCT: 1.1 %
Monocyte #: 0.1 x10 3/mm — ABNORMAL LOW (ref 0.2–0.9)
NEUTROS ABS: 4.6 x10 3/mm (ref 1.4–6.5)
NEUTROS PCT: 84.1 %
Platelet: 153 x10 3/mm (ref 150–440)
RBC: 3.42 10*6/uL — ABNORMAL LOW (ref 3.80–5.20)
RDW: 14.7 % — AB (ref 11.5–14.5)
WBC: 5.5 x10 3/mm (ref 3.6–11.0)

## 2013-12-29 LAB — COMPREHENSIVE METABOLIC PANEL
AST: 14 U/L — AB (ref 15–37)
Albumin: 4 g/dL (ref 3.4–5.0)
Alkaline Phosphatase: 88 U/L
Anion Gap: 11 (ref 7–16)
BUN: 16 mg/dL (ref 7–18)
Bilirubin,Total: 0.6 mg/dL (ref 0.2–1.0)
Calcium, Total: 9.7 mg/dL (ref 8.5–10.1)
Chloride: 104 mmol/L (ref 98–107)
Co2: 27 mmol/L (ref 21–32)
Creatinine: 1.09 mg/dL (ref 0.60–1.30)
EGFR (African American): >60
EGFR (Non-African Amer.): 55 — ABNORMAL LOW
GLUCOSE: 175 mg/dL — AB (ref 65–99)
OSMOLALITY: 289 (ref 275–301)
Potassium: 4.3 mmol/L (ref 3.5–5.1)
SGPT (ALT): 26 U/L (ref 12–78)
SODIUM: 142 mmol/L (ref 136–145)
Total Protein: 7.6 g/dL (ref 6.4–8.2)

## 2013-12-29 LAB — HER-2 / NEU, FISH
AVG NUM CEP17 PROBES/NUCLEUS: 2.1
AVG NUM HER-2 SIGNALS/NUCLEUS: 2.2
HER-2/CEP17 Ratio: 1.09
NUMBER OF OBSERVERS: 2
NUMBER OF TUMOR CELLS COUNTED: 40

## 2013-12-29 LAB — ER/PR,IMMUNOHISTOCHEM,PARAFFIN: Estrogen Receptor IHC: <1 %

## 2014-01-03 LAB — CBC CANCER CENTER
BASOS ABS: 0 x10 3/mm (ref 0.0–0.1)
Basophil %: 1.3 %
Eosinophil #: 0 x10 3/mm (ref 0.0–0.7)
Eosinophil %: 2.4 %
HCT: 30.6 % — ABNORMAL LOW (ref 35.0–47.0)
HGB: 10.5 g/dL — ABNORMAL LOW (ref 12.0–16.0)
LYMPHS PCT: 72.5 %
Lymphocyte #: 1.1 x10 3/mm (ref 1.0–3.6)
MCH: 30.1 pg (ref 26.0–34.0)
MCHC: 34.3 g/dL (ref 32.0–36.0)
MCV: 88 fL (ref 80–100)
MONO ABS: 0 x10 3/mm — AB (ref 0.2–0.9)
Monocyte %: 1.8 %
NEUTROS ABS: 0.3 x10 3/mm — AB (ref 1.4–6.5)
NEUTROS PCT: 22 %
PLATELETS: 243 x10 3/mm (ref 150–440)
RBC: 3.5 10*6/uL — AB (ref 3.80–5.20)
RDW: 14.7 % — ABNORMAL HIGH (ref 11.5–14.5)
WBC: 1.5 x10 3/mm — CL (ref 3.6–11.0)

## 2014-01-03 LAB — COMPREHENSIVE METABOLIC PANEL
Albumin: 3.9 g/dL (ref 3.4–5.0)
Alkaline Phosphatase: 83 U/L
Anion Gap: 12 (ref 7–16)
BILIRUBIN TOTAL: 0.9 mg/dL (ref 0.2–1.0)
BUN: 14 mg/dL (ref 7–18)
CHLORIDE: 101 mmol/L (ref 98–107)
Calcium, Total: 9.8 mg/dL (ref 8.5–10.1)
Co2: 24 mmol/L (ref 21–32)
Creatinine: 0.86 mg/dL (ref 0.60–1.30)
EGFR (Non-African Amer.): >60
GLUCOSE: 98 mg/dL (ref 65–99)
OSMOLALITY: 274 (ref 275–301)
Potassium: 3.5 mmol/L (ref 3.5–5.1)
SGOT(AST): 14 U/L — ABNORMAL LOW (ref 15–37)
SGPT (ALT): 23 U/L (ref 12–78)
Sodium: 137 mmol/L (ref 136–145)
TOTAL PROTEIN: 7.3 g/dL (ref 6.4–8.2)

## 2014-01-08 LAB — CULTURE, BLOOD (SINGLE)

## 2014-01-18 DIAGNOSIS — G620 Drug-induced polyneuropathy: Secondary | ICD-10-CM | POA: Insufficient documentation

## 2014-01-18 HISTORY — DX: Drug-induced polyneuropathy: G62.0

## 2014-01-19 LAB — CBC CANCER CENTER
BASOS ABS: 0 x10 3/mm (ref 0.0–0.1)
BASOS PCT: 0.2 %
Eosinophil #: 0 x10 3/mm (ref 0.0–0.7)
Eosinophil %: 0 %
HCT: 26.4 % — AB (ref 35.0–47.0)
HGB: 8.9 g/dL — ABNORMAL LOW (ref 12.0–16.0)
Lymphocyte #: 0.6 x10 3/mm — ABNORMAL LOW (ref 1.0–3.6)
Lymphocyte %: 14 %
MCH: 30.7 pg (ref 26.0–34.0)
MCHC: 33.9 g/dL (ref 32.0–36.0)
MCV: 91 fL (ref 80–100)
MONOS PCT: 1.6 %
Monocyte #: 0.1 x10 3/mm — ABNORMAL LOW (ref 0.2–0.9)
NEUTROS PCT: 84.2 %
Neutrophil #: 3.8 x10 3/mm (ref 1.4–6.5)
Platelet: 125 x10 3/mm — ABNORMAL LOW (ref 150–440)
RBC: 2.91 10*6/uL — ABNORMAL LOW (ref 3.80–5.20)
RDW: 16.3 % — AB (ref 11.5–14.5)
WBC: 4.5 x10 3/mm (ref 3.6–11.0)

## 2014-01-19 LAB — COMPREHENSIVE METABOLIC PANEL
ALT: 27 U/L
Albumin: 3.8 g/dL (ref 3.4–5.0)
Alkaline Phosphatase: 84 U/L
Anion Gap: 13 (ref 7–16)
BUN: 18 mg/dL (ref 7–18)
Bilirubin,Total: 0.5 mg/dL (ref 0.2–1.0)
Calcium, Total: 9.4 mg/dL (ref 8.5–10.1)
Chloride: 102 mmol/L (ref 98–107)
Co2: 23 mmol/L (ref 21–32)
Creatinine: 1.16 mg/dL (ref 0.60–1.30)
EGFR (African American): 59 — ABNORMAL LOW
GFR CALC NON AF AMER: 51 — AB
GLUCOSE: 193 mg/dL — AB (ref 65–99)
Osmolality: 283 (ref 275–301)
POTASSIUM: 4 mmol/L (ref 3.5–5.1)
SGOT(AST): 15 U/L (ref 15–37)
Sodium: 138 mmol/L (ref 136–145)
Total Protein: 7.3 g/dL (ref 6.4–8.2)

## 2014-01-20 LAB — CANCER ANTIGEN 27.29: CA 27.29: 35.5 U/mL (ref 0.0–38.6)

## 2014-01-28 ENCOUNTER — Ambulatory Visit: Payer: Self-pay | Admitting: Oncology

## 2014-02-16 LAB — COMPREHENSIVE METABOLIC PANEL
AST: 14 U/L — AB (ref 15–37)
Albumin: 3.7 g/dL (ref 3.4–5.0)
Alkaline Phosphatase: 92 U/L
Anion Gap: 7 (ref 7–16)
BUN: 9 mg/dL (ref 7–18)
Bilirubin,Total: 0.8 mg/dL (ref 0.2–1.0)
CALCIUM: 9 mg/dL (ref 8.5–10.1)
Chloride: 106 mmol/L (ref 98–107)
Co2: 30 mmol/L (ref 21–32)
Creatinine: 1.02 mg/dL (ref 0.60–1.30)
GFR CALC NON AF AMER: 59 — AB
Glucose: 99 mg/dL (ref 65–99)
OSMOLALITY: 284 (ref 275–301)
Potassium: 3.9 mmol/L (ref 3.5–5.1)
SGPT (ALT): 19 U/L
SODIUM: 143 mmol/L (ref 136–145)
Total Protein: 6.9 g/dL (ref 6.4–8.2)

## 2014-02-16 LAB — CBC CANCER CENTER
Basophil #: 0 x10 3/mm (ref 0.0–0.1)
Basophil %: 0.6 %
EOS ABS: 0.1 x10 3/mm (ref 0.0–0.7)
Eosinophil %: 2 %
HCT: 26.1 % — AB (ref 35.0–47.0)
HGB: 8.7 g/dL — AB (ref 12.0–16.0)
LYMPHS PCT: 27.6 %
Lymphocyte #: 1 x10 3/mm (ref 1.0–3.6)
MCH: 32.5 pg (ref 26.0–34.0)
MCHC: 33.4 g/dL (ref 32.0–36.0)
MCV: 97 fL (ref 80–100)
Monocyte #: 0.3 x10 3/mm (ref 0.2–0.9)
Monocyte %: 9.2 %
NEUTROS ABS: 2.1 x10 3/mm (ref 1.4–6.5)
Neutrophil %: 60.6 %
Platelet: 323 x10 3/mm (ref 150–440)
RBC: 2.68 10*6/uL — ABNORMAL LOW (ref 3.80–5.20)
RDW: 20.7 % — ABNORMAL HIGH (ref 11.5–14.5)
WBC: 3.5 x10 3/mm — ABNORMAL LOW (ref 3.6–11.0)

## 2014-02-28 ENCOUNTER — Ambulatory Visit: Payer: Self-pay | Admitting: Oncology

## 2014-03-30 ENCOUNTER — Ambulatory Visit: Payer: Self-pay | Admitting: Oncology

## 2014-04-08 IMAGING — MG MM CAD DIAGNOSTIC MAMMO
1 series · 6 of 6 positions shown · non-contrast
Comparison: none

REASON FOR EXAM: HX BRST CA
COMMENTS:

[R CC · right · 6 of 6 slices shown]
[im 1/6]
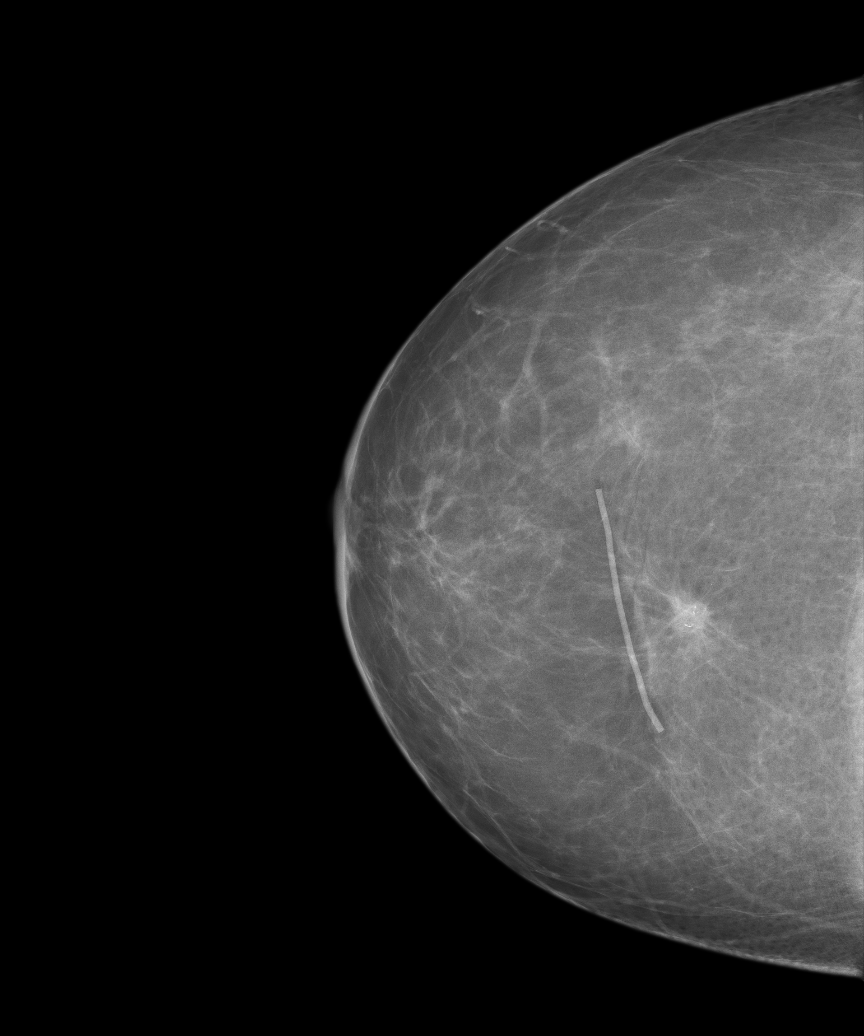
[im 2/6]
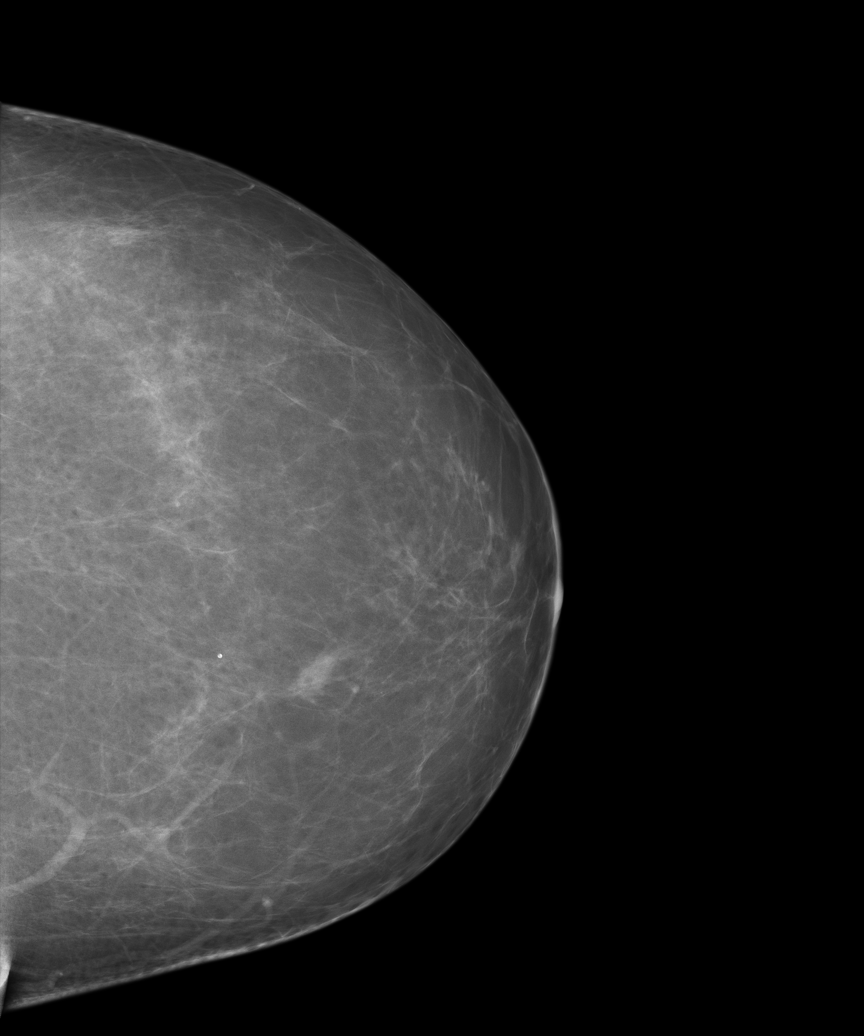
[im 3/6]
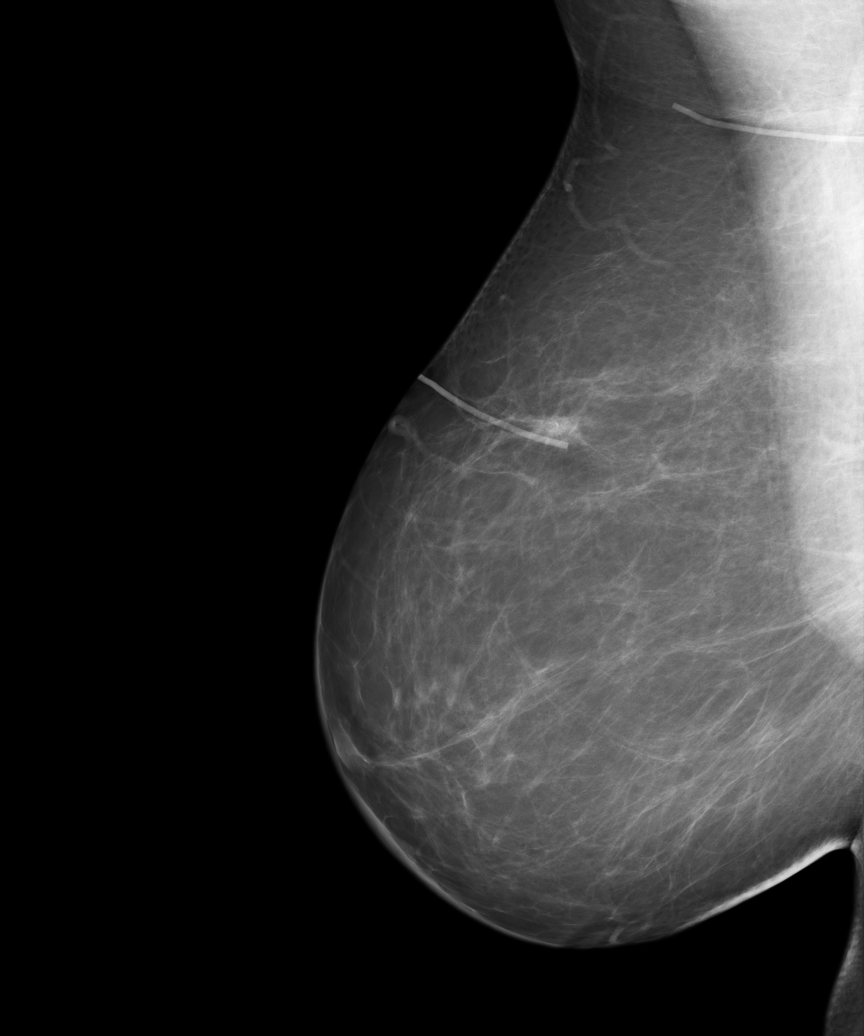
[im 4/6]
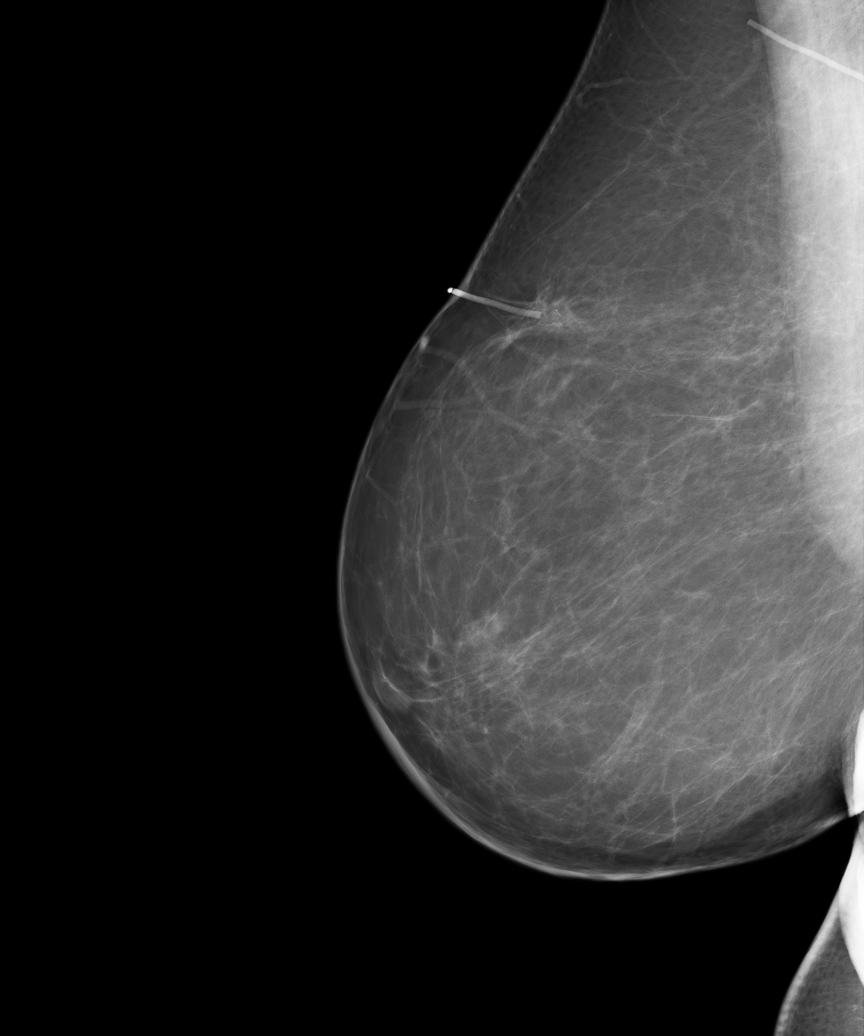
[im 5/6]
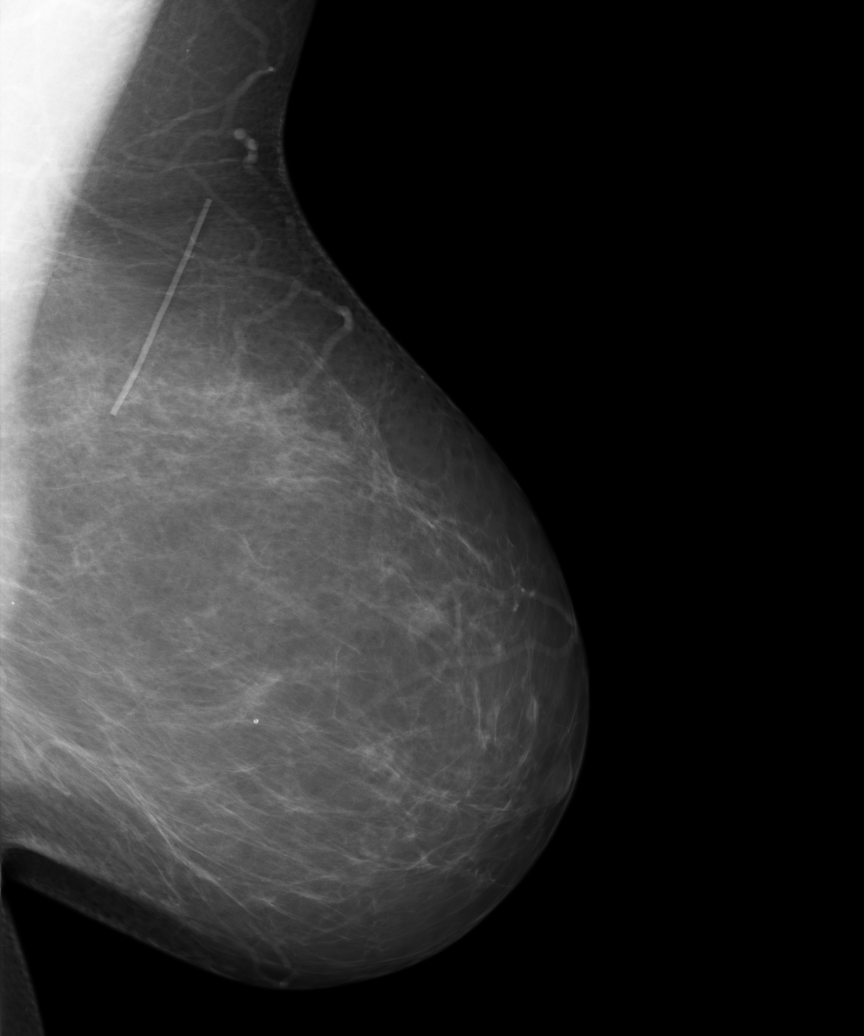
[im 6/6]
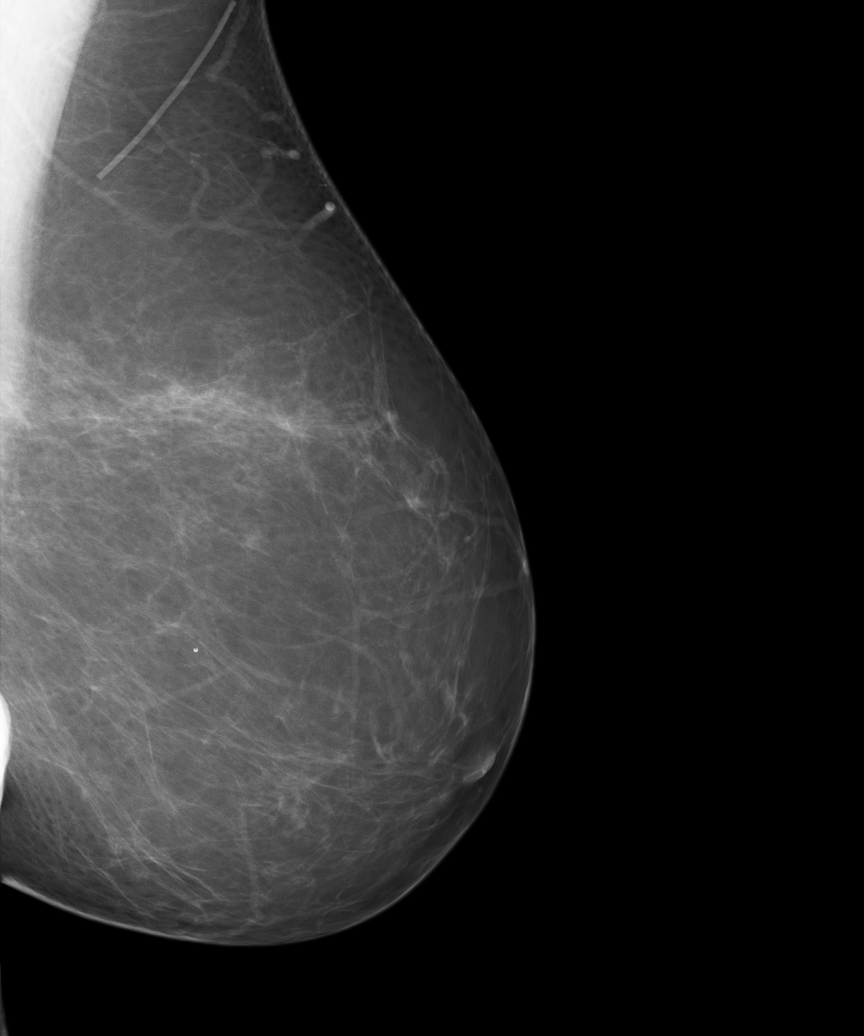

[6 of 6 positions shown; findings below may reference images not displayed]

PROCEDURE:     MAM - MAM DGTL DIAGNOSTIC MAMMO W/CAD  - October 13, 2011  [DATE]

RESULT:

No dominant masses or pathologic clustered calcifications demonstrated. CAD
evaluation is nonfocal. Changes consistent with scarring are noted. The
breasts are predominantly fatty replaced. Calcification is noted within scar
in the right breast. This region is stable.
IMPRESSION: Benign exam. Routine yearly follow-up mammograms suggested.

BI-RADS: Category 2 - Benign Findings

Thank you for this opportunity to contribute to the care of your patient.

A NEGATIVE MAMMOGRAM REPORT DOES NOT PRECLUDE BIOPSY OR OTHER EVALUATION OF
A CLINICALLY PALPABLE OR OTHERWISE SUSPICIOUS MASS OR LESION. BREAST CANCER
MAY NOT BE DETECTED BY MAMMOGRAPHY IN UP TO 10% OF CASES.

## 2014-05-01 ENCOUNTER — Encounter: Payer: Self-pay | Admitting: General Surgery

## 2014-05-18 ENCOUNTER — Ambulatory Visit: Payer: Self-pay | Admitting: Oncology

## 2014-05-18 LAB — COMPREHENSIVE METABOLIC PANEL
ALBUMIN: 3.9 g/dL (ref 3.4–5.0)
Alkaline Phosphatase: 95 U/L
Anion Gap: 8 (ref 7–16)
BILIRUBIN TOTAL: 0.5 mg/dL (ref 0.2–1.0)
BUN: 16 mg/dL (ref 7–18)
Calcium, Total: 9.4 mg/dL (ref 8.5–10.1)
Chloride: 106 mmol/L (ref 98–107)
Co2: 28 mmol/L (ref 21–32)
Creatinine: 0.9 mg/dL (ref 0.60–1.30)
EGFR (African American): >60
Glucose: 97 mg/dL (ref 65–99)
Osmolality: 284 (ref 275–301)
POTASSIUM: 4.2 mmol/L (ref 3.5–5.1)
SGOT(AST): 13 U/L — ABNORMAL LOW (ref 15–37)
SGPT (ALT): 24 U/L
Sodium: 142 mmol/L (ref 136–145)
Total Protein: 7.1 g/dL (ref 6.4–8.2)

## 2014-05-18 LAB — CBC CANCER CENTER
BASOS ABS: 0 x10 3/mm (ref 0.0–0.1)
BASOS PCT: 0.7 %
EOS ABS: 0.1 x10 3/mm (ref 0.0–0.7)
Eosinophil %: 1.7 %
HCT: 34.7 % — AB (ref 35.0–47.0)
HGB: 11.2 g/dL — ABNORMAL LOW (ref 12.0–16.0)
LYMPHS PCT: 32 %
Lymphocyte #: 1.5 x10 3/mm (ref 1.0–3.6)
MCH: 29.7 pg (ref 26.0–34.0)
MCHC: 32.3 g/dL (ref 32.0–36.0)
MCV: 92 fL (ref 80–100)
Monocyte #: 0.3 x10 3/mm (ref 0.2–0.9)
Monocyte %: 7.3 %
NEUTROS ABS: 2.7 x10 3/mm (ref 1.4–6.5)
Neutrophil %: 58.3 %
PLATELETS: 218 x10 3/mm (ref 150–440)
RBC: 3.76 10*6/uL — AB (ref 3.80–5.20)
RDW: 12.9 % (ref 11.5–14.5)
WBC: 4.6 x10 3/mm (ref 3.6–11.0)

## 2014-05-19 LAB — CANCER ANTIGEN 27.29: CA 27.29: 25 U/mL (ref 0.0–38.6)

## 2014-05-29 ENCOUNTER — Ambulatory Visit (INDEPENDENT_AMBULATORY_CARE_PROVIDER_SITE_OTHER): Payer: 59 | Admitting: General Surgery

## 2014-05-29 ENCOUNTER — Encounter: Payer: Self-pay | Admitting: General Surgery

## 2014-05-29 VITALS — BP 130/70 | HR 74 | Resp 12 | Ht 65.0 in | Wt 176.0 lb

## 2014-05-29 DIAGNOSIS — C50412 Malignant neoplasm of upper-outer quadrant of left female breast: Secondary | ICD-10-CM

## 2014-05-29 DIAGNOSIS — Z1509 Genetic susceptibility to other malignant neoplasm: Secondary | ICD-10-CM

## 2014-05-29 DIAGNOSIS — Z1501 Genetic susceptibility to malignant neoplasm of breast: Secondary | ICD-10-CM

## 2014-05-29 DIAGNOSIS — Z853 Personal history of malignant neoplasm of breast: Secondary | ICD-10-CM

## 2014-05-29 NOTE — Patient Instructions (Addendum)
Patient t return in six months. Call for any concerns or new problems

## 2014-05-29 NOTE — Progress Notes (Signed)
Patient ID: Morgan Flores, female   DOB: Jun 21, 1951, 63 y.o.   MRN: [id number]  Chief Complaint  Patient presents with  . Follow-up    breast cancer    HPI Morgan Flores is a 63 y.o. female here today following up from bilateral mastectomy done April 2015.Patient states she is doing well. Patient reports lack of sensation of needing to urinate and will be seeing a urologist for this. Last chemo in 08/15.  HPI  Past Medical History  Diagnosis Date  . Endocrine problem 2009    thyroid  . Personal history of malignant neoplasm of breast 2009    right breast  . Breast screening, unspecified   . Special screening for malignant neoplasms, colon   . Obesity, unspecified   . Hiatal hernia 2011  . Bladder infection   . Acid reflux 2006  . Cancer 1997    cervical    Past Surgical History  Procedure Laterality Date  . Portacath placement  2009,11/15/13  . Cesarean section  1983  . Abdominal hysterectomy  1997  . Port-a-cath removal  2012  . Colonoscopy  2011    Dr. Vira Agar  . Upper gi endoscopy  2011    Dr. Vira Agar  . Appendectomy    . Breast surgery Bilateral 10/06/13    mastectomy    Family History  Problem Relation Age of Onset  . Cancer Paternal Grandfather     cancer of the heel    Social History History  Substance Use Topics  . Smoking status: Never Smoker   . Smokeless tobacco: Never Used  . Alcohol Use: No    Allergies  Allergen Reactions  . Omeprazole   . Tape     Redness   . Sulfa Antibiotics Rash    Current Outpatient Prescriptions  Medication Sig Dispense Refill  . atorvastatin (LIPITOR) 10 MG tablet Take 10 mg by mouth daily.    . Cholecalciferol (VITAMIN D3) 1000 UNITS CAPS Take by mouth daily.    [name] dexamethasone (DECADRON) 0.5 MG/5ML solution     . esomeprazole (NEXIUM) 40 MG capsule Take 40 mg by mouth daily.    [name] ketorolac (TORADOL) 10 MG tablet     . levothyroxine (SYNTHROID, LEVOTHROID) 50 MCG tablet Take 50 mcg by mouth daily.     . metoCLOPramide (REGLAN) 10 MG tablet     . Multiple Vitamin (MULTIVITAMIN) capsule Take 1 capsule by mouth daily.    . nitrofurantoin, macrocrystal-monohydrate, (MACROBID) 100 MG capsule     . ondansetron (ZOFRAN) 4 MG tablet Take 1 tablet by mouth daily.    [name] oxyCODONE-acetaminophen (PERCOCET/ROXICET) 5-325 MG per tablet     . vitamin C (ASCORBIC ACID) 500 MG tablet Take 500 mg by mouth daily.     No current facility-administered medications for this visit.    Review of Systems Review of Systems  Constitutional: Negative.   Respiratory: Negative.   Cardiovascular: Negative.     Blood pressure 130/70, pulse 74, resp. rate 12, height '5\' 5"'  (1.651 m), weight 176 lb (79.833 kg).  Physical Exam Physical Exam  Constitutional: She is oriented to person, place, and time. She appears well-developed and well-nourished.  Eyes: Conjunctivae are normal. No scleral icterus.  Neck: Neck supple.  Cardiovascular: Normal rate and normal heart sounds.   Pulmonary/Chest: Effort normal and breath sounds normal.  B/l mastectomy sites with well healed incisions  Abdominal: Soft. Bowel sounds are normal. There is no hepatomegaly. There is no tenderness. No hernia.  Lymphadenopathy:  She has no cervical adenopathy.    She has no axillary adenopathy.  Neurological: She is alert and oriented to person, place, and time.  Skin: Skin is warm and dry.    Data Reviewed Notes reviewed  Assessment   Stable exam. 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 BRCA 1 positive. S/P mastectomy . Recent chemo completed .        Plan    Pt still needs to have ovaries removed. Due to scarring and adhesions ovaries were not identified at last attempt. Her case was discussed with Dr. Oliva Bustard. Plan for GYN/Oncology consult Follow up 69mo.       SANKAR,SEEPLAPUTHUR G 05/29/2014, 6:22 PM

## 2014-05-30 ENCOUNTER — Ambulatory Visit: Payer: Self-pay | Admitting: Oncology

## 2014-07-19 ENCOUNTER — Ambulatory Visit: Payer: Self-pay | Admitting: Oncology

## 2014-07-31 ENCOUNTER — Ambulatory Visit: Payer: Self-pay | Admitting: Oncology

## 2014-09-05 ENCOUNTER — Ambulatory Visit
Admit: 2014-09-05 | Disposition: A | Discharge status: Home / Self Care | Payer: Self-pay | Attending: Oncology | Admitting: Oncology

## 2014-09-29 ENCOUNTER — Ambulatory Visit
Admit: 2014-09-29 | Disposition: A | Discharge status: Home / Self Care | Payer: Self-pay | Attending: Oncology | Admitting: Oncology

## 2014-10-21 NOTE — Op Note (Signed)
PATIENT NAME:  Morgan Flores, HOHEISEL MR#:  [id number] DATE OF BIRTH:  10/22/1950  DATE OF PROCEDURE:  10/05/2013  PREOPERATIVE DIAGNOSIS: BRCA1 positive, breast cancer, recurrent, for removal of ovaries.  POSTOPERATIVE DIAGNOSIS: BRCA1 positive, breast cancer, recurrent, for removal of ovaries.  PROCEDURE:  Lysis of severe abdominal adhesions of bowel to the sidewalls, incidental cystotomy with laparoscopic repair.  No removal of the ovaries and tubes.   ESTIMATED BLOOD LOSS: 50 mL.   SURGEON: Delsa Sale, MD  ASSISTANTS IN PART:  Denice Bors. Jacqlyn Larsen, MD, Mckinley Jewel, MD  FINDINGS: Massive amounts of large bowel and rectosigmoid from the paracolic gutters down into the posterior cul-de-sac. Bowel adhered to the bladder requiring cystotomy to keep the bowel from being punctured. Right side bowel attached to the sidewall and the arterial blood supply at the sidewall, not taken down due to proximity to large vascular supply, inability to find the ovaries. The bowels were dissected down from the ileum to the cul-de-sac on the right and from the IP area on the left with no sign of ovaries or tubes.   PROCEDURE: Patient was taken to the operating room and placed in supine position. After adequate general endotracheal anesthesia was instilled, the patient was prepped and draped in the usual sterile fashion. A sponge stick was placed in the patient's vagina as she has had a previous radical hysterectomy.  Attention was then turned to the umbilicus and was injected with Marcaine. Incision was created, fascia was grasped, and entered with a Veress needle.  Hanging drop test, fluid instillation test, and fluid aspiration test showed proper placement of the Veress needle. The CO2 was placed on low flow. When tympany was heard around the liver, CO2 was placed on high flow. The Veress needle was removed and trocar was placed under direct visualization with an XCEL trocar. The aforementioned findings were seen. The  patient was placed in Trendelenburg and 2 ports, an 11 on the left and a 5 on the right, were placed under direct visualization. After the bowels had been pulled down off the anterior abdominal wall and the previous incision with the radical hysterectomy, the omentum was cut with a Harmonic scalpel to be detached. Attention was then turned to the anatomy which was perused. Photographs were taken. The patient's bowel adhesions were then slowly and carefully dissected off the side walls. Muscle bellies were identified. The iliac arteries and vein were identified. Ureters were identified. At this time, no ovaries or tubes are seen. Dissection was then carried back further to the ileum into the gutters at the point of the appendix and the same position on the left side of the patient's body and no tissue was seen resembling an ovary or a tube. Dr. Jamal Collin was present at the time and, although not assisting with the surgery, concurred that nothing was seen.  At this time, the bowels down at the cuff were noted to be still adhered and the bowels were then attempted to be removed from the bladder. Incidental cystotomy occurred. Multiple sutures were placed with an Endo Stitch and Dr. Jacqlyn Larsen was called to assist. After Dr. Jacqlyn Larsen had performed his portion of the procedure with cystoscopy, he (Dictation Anomaly) threw  GU sutures in the bladder. These were then tied intracorporeally by me.  Arista was placed into the abdomen over the surfaces that were raw.  After the abdomen was copiously cleared with warm normal saline, the CO2 was allowed to escape. Indwelling catheter was placed. Trocars were removed.  UR-6 was used to tie the umbilicus to the lower left quadrant and the Dermabond was placed after 4-0 Monocryl to approximate the skin edges; Dermabond, Tegaderm, and Telfa. The patient was then laid supine and prepped for Dr. Angie Fava portion of the surgery.    ____________________________ Delsa Sale,  MD cck:dd D: 10/10/2013 00:36:00 ET T: 10/10/2013 01:47:57 ET JOB#: [id number]  cc: Delsa Sale, MD, <Dictator> Delsa Sale MD ELECTRONICALLY SIGNED 10/13/2013 16:07

## 2014-10-21 NOTE — Op Note (Signed)
PATIENT NAME:  Morgan Flores, Morgan Flores MR#:  [id number] DATE OF BIRTH:  08-06-1950  DATE OF PROCEDURE:  10/05/2013  PRINCIPAL DIAGNOSIS: Bladder perforation.   POSTOPERATIVE DIAGNOSIS: Bladder perforation.   PROCEDURE: Laparoscopic cystorrhaphy.   SURGEON: Denice Bors. Jacqlyn Larsen, MD   ANESTHESIA: General endotracheal anesthesia.   INDICATIONS: The patient is a 64 year old female who presented for GYN surgery. She had extensive pelvic adhesions resulting in a bladder perforation. An initial attempt was made at bladder repair by GYN. Urology was consulted intraoperative for further bladder repair and evaluation.   PROCEDURE: As part of the ongoing procedure, which will be dictated under a separate operative note by gynecology, the patient was undergoing GYN surgery. She had a bladder perforation on the posterior left bladder wall, medial to the ureteral orifice. A prior attempt had been made at repair by gynecology. There was a central protrusion of bladder mucosa stained with indigo carmine. Some leakage of urine was noted with any attempt at filling the bladder. Cystoscopy was performed for further evaluation of the exact location of the rent. The indwelling Foley catheter was removed. Cystoscopy was performed with a 22-French rigid cystoscope. Upon entering the bladder, an area of suture and slight bunching of the surrounding bladder tissue was encountered. This was just medial to the left ureteral orifice.Adequate distance, however, was present. Due to concerns of the relative close proximity, a flexible tip Glidewire was introduced into the left ureteral orifice. It was easily advanced into the upper pole collecting system without difficulty. There was no evidence of any guidewire passage into the peritoneal compartment. The ureter could be visualized extending along the lateral pelvic sidewall away from the area of perforation. The decision was made to place additional sutures for better bladder closure. A  figure-of-eight 2-0 Vicryl suture was placed utilizing a horseshoe needle through the port utilizing laparoscopic needle drivers. The initial plan was to secure the suture using a Lapra-Ty. The Lapra-Ty's, however, were not available in the operating room. As a result, laparoscopic suturing was required. This resulted in some pulling on the suture site, leading to additional leakage. An additional suture was placed to help reinforce the areas. Once again, due to tugging on the incision during laparoscopic tying, additional leakage was encountered. The overall degree of leakage was felt to be relatively minimal. The bladder tissue was noted to be very thin. It was felt that any further attempts could potentially compromise the left ureter. The decision was made to allow bladder decompression with a Foley catheter and subsequent healing, as it was felt any further attempts would be more detrimental to the bladder. The other option that was considered was open cystorrhaphy; however, this was decided against based on the overall appearance of the repair. Indigo carmine had been given at the onset of initial urological evaluation. Re-examination of the bladder with the cystoscope demonstrated efflux of urine and indigo carmine from both of the ureteral orifices. The bladder was drained. The cystoscope was removed. The 16-French Foley catheter was replaced to gravity drainage. The remainder of the procedure was completed by gynecology. This will be dictated under a separate operative note. There were no other intraoperative problems or complications, other than the lack of proper equipment.    ____________________________ Denice Bors. Jacqlyn Larsen, MD bsc:jcm D: 10/05/2013 19:08:05 ET T: 10/05/2013 20:01:12 ET JOB#: [id number]  cc: Denice Bors. Jacqlyn Larsen, MD, <Dictator> Delsa Sale, MD Denice Bors Rabon Scholle MD ELECTRONICALLY SIGNED 10/05/2013 23:46

## 2014-10-21 NOTE — Op Note (Signed)
PATIENT NAME:  Morgan Flores, Morgan Flores MR#:  [id number] DATE OF BIRTH:  March 20, 1951  DATE OF PROCEDURE:  11/15/2013  PREOPERATIVE DIAGNOSIS: Carcinoma of the breast.   POSTOPERATIVE DIAGNOSIS: Carcinoma of the breast.  OPERATION: Insertion of venous access port with ultrasound and fluoroscopic guidance.   SURGEON: S.G. Jamal Collin, M.D.   ANESTHESIA: Monitored care and local anesthetic containing 0.5% Marcaine with 1% Xylocaine.   COMPLICATIONS: None.   ESTIMATED BLOOD LOSS: Minimal.   DRAINS: None.   PROCEDURE: The patient was placed in the supine position on the operating room table and then placed in slight Trendelenburg position. With adequate sedation and monitoring, the right upper chest and neck area were prepped and draped out as a sterile field. With ultrasound probe brought up to the field, the subclavian vein was identified beneath the lateral end of the clavicle. This area was marked. A timeout was performed. A small stab incision was made below the left lateral end of the clavicle and a needle was then positioned in the subclavian vein using ultrasound guidance with free withdrawal of blood. Seldinger technique was then used and the catheter was positioned going into the distal superior vena cava with a skin marking at about 22 cm. This was achieved with fluoroscopy. A subcutaneous port was then mapped over the second costal cartilage area and the area was infiltrated adequately with local anesthetic. A skin incision was made. A subcutaneous pocket was created with cautery. The catheter was then tunneled through to the port site and cut to approximate length. It was then fixed to the prefilled port. The port was then anchored to the underlying fascia in the pocket with 3 stitches of 2-0 Prolene and then flushed through with heparinized saline. Fluoroscopy was repeated once again to ensure no kinking of the catheter and that it was going into the superior vena cava. The subcutaneous tissue was  closed with 3-0 Vicryl and the skin with subcuticular 4-0 Vicryl covered with Dermabond. Procedure was well tolerated. The patient subsequently returned to the recovery room in stable condition.   ____________________________ S.[name], MD sgs:aw D: 11/16/2013 08:16:20 ET T: 11/16/2013 08:23:32 ET JOB#: [id number]  cc: Synthia Innocent. Jamal Collin, MD, <Dictator> [facility] [name] MD ELECTRONICALLY SIGNED 11/17/2013 14:44

## 2014-10-21 NOTE — Op Note (Signed)
PATIENT NAME:  Morgan Flores, Morgan Flores MR#:  [id number] DATE OF BIRTH:  1950-07-06  DATE OF PROCEDURE:  10/05/2013  PREOPERATIVE DIAGNOSES:  1. Left breast invasive carcinoma.  2. History of right breast cancer. 3. BRCA gene positive.   OPERATION PERFORMED:  1. Laparoscopy, attempted oophorectomy, lysis of adhesions with bladder repair. 2. Left total mastectomy with sentinel node biopsy.  3. Right total mastectomy.   ANESTHESIA: General.   COMPLICATIONS: None.   ESTIMATED BLOOD LOSS: Approximately 100 mL total.  SURGEON: S.G. Jamal Collin, MD  CO-SURGEONDelsa Sale, MD  CONSULTANT: Denice Bors. Cope, MD  CLINICAL NOTE: This patient was found to have BRCA gene positive status, and she elected to have bilateral mastectomy along with oophorectomy. The patient has already undergone treatment for right breast cancer with lumpectomy, axillary dissection and radiation several years ago, that was a triple negative cancer. Currently, the patient has an invasive cancer in the left breast which is ER negative, PR mildly positive and HER-2 negative. The patient opted to proceed with bilateral mastectomy and oophorectomy based on her genetic status.   DESCRIPTION OF PROCEDURE: The patient was put to sleep and initially underwent laparoscopy by Dr. Cristino Martes. This portion will be dictated by her. The ovaries could not be found, and there was a significant amount of adhesions in the pelvic area which required lysis to explore for the ovaries. In the course of this, a small opening in the urinary bladder was made which required repair and consultation from Dr. Jacqlyn Larsen. In the absence of any identifiable ovary, no further maneuvers were utilized, and attention was then directed to the mastectomy. The patient was placed then in the supine position. A Foley catheter was left in place at this time.   The two breasts were then prepped and draped out as a sterile field. The patient had excellent signal activity from nuclear  contrast in the left breast in the axilla, but she also had 5 mL of 50% diluted methylene blue instilled in the subareolar region prior to prepping. After a timeout was performed, the left side was operated on first. An elliptical skin incision was mapped out on both sides to match. The skin incision on the left side was made along the superior aspect in the lateral portion of this, and the skin and subcutaneous tissue were elevated towards the axillary tail and the axilla. With the use of the gamma finder, 2 nodes were identified with intense signal activity and both of them appearing blue with the dye. These were removed and sent off as sentinel nodes 1 and 2, with subsequent frozen section showing no evidence of macrometastases. There were no other palpable nodes in the axilla, and the signal activity died completely in the axilla following removal of these 2 nodes. Hemostasis was obtained with cautery and ligatures of 3-0 Vicryl. The superior skin flap was then created all the way to the infraclavicular region until the pectoral fascia was identified. The lower flap was then created down to the upper rectus fascia, and the left breast along with the pectoral fascia was dissected off the pectoralis major muscle from the medial to the lateral aspect and removed. The lateral end was tagged for orientation and sent to pathology. Hemostasis was obtained with cautery and ligatures of 3-0 Vicryl and some suture ligatures of 3-0 silk on the chest wall area. The wound was irrigated. A Blake drain was positioned and brought out through a stab incision inferiorly and fastened to the skin with a  nylon stitch. Subcutaneous tissue was closed with interrupted 2-0 Vicryl stitch and the skin closed with a subcuticular running stitch of 3-0 Monocryl. Following this, the right breast was operated on. The elliptical skin incision was then extended superiorly and inferiorly, similar to the left side, to the infraclavicular and upper  rectus levels, and the breast along with the pectoral fascia was dissected off the underlying pectoral muscle, lateral end was tagged and sent to pathology. Then ensuring hemostasis, the wound was irrigated, and a Blake drain was positioned similar to the left side. Closure obtained with 2-0 Vicryl in the subcutaneous tissue and the skin with subcuticular 3-0 Monocryl. Both incisions were covered with Dermabond. A padded dressing was placed over both sides and held in place with a surgical bra. The procedure was well tolerated. She was subsequently extubated and returned to the recovery room in stable condition.   ____________________________ S.[name], MD sgs:lb D: 10/06/2013 08:39:46 ET T: 10/06/2013 08:48:34 ET JOB#: [id number]  cc: Morgan Flores. Jamal Collin, MD, <Dictator> [facility] [name] MD ELECTRONICALLY SIGNED 10/07/2013 8:51

## 2014-11-23 ENCOUNTER — Encounter: Payer: Self-pay | Admitting: General Surgery

## 2014-11-23 ENCOUNTER — Ambulatory Visit (INDEPENDENT_AMBULATORY_CARE_PROVIDER_SITE_OTHER): Payer: 59 | Admitting: General Surgery

## 2014-11-23 VITALS — BP 130/74 | HR 74 | Temp 98.1°F | Resp 12 | Ht 65.0 in | Wt 174.0 lb

## 2014-11-23 DIAGNOSIS — Z853 Personal history of malignant neoplasm of breast: Secondary | ICD-10-CM | POA: Diagnosis not present

## 2014-11-23 DIAGNOSIS — Z1501 Genetic susceptibility to malignant neoplasm of breast: Secondary | ICD-10-CM

## 2014-11-23 DIAGNOSIS — Z1509 Genetic susceptibility to other malignant neoplasm: Secondary | ICD-10-CM

## 2014-11-23 MED ORDER — AZITHROMYCIN 250 MG PO TABS: ORAL_TABLET | ORAL | Status: DC

## 2014-11-23 NOTE — Progress Notes (Signed)
Patient ID: Morgan Flores, female   DOB: 02-02-1951, 64 y.o.   MRN: [id number]  Chief Complaint  Patient presents with  . Follow-up    breast cancer    HPI Morgan Flores is a 64 y.o. female here today for her follow up breast cancer check up. She had bilateral mastectomy 1 yr ago. Patient states she has been doing well with no new problems at this time. Patient is seeing  gyn oncologist next week for removal of ovaries. She is BRCA 1carrier HPI  Past Medical History  Diagnosis Date  . Endocrine problem 2009    thyroid  . Personal history of malignant neoplasm of breast 2009    right breast  . Breast screening, unspecified   . Special screening for malignant neoplasms, colon   . Obesity, unspecified   . Hiatal hernia 2011  . Bladder infection   . Acid reflux 2006  . Cancer 1997    cervical    Past Surgical History  Procedure Laterality Date  . Portacath placement  2009,11/15/13  . Cesarean section  1983  . Abdominal hysterectomy  1997  . Port-a-cath removal  2012  . Colonoscopy  2011    Dr. Vira Agar  . Upper gi endoscopy  2011    Dr. Vira Agar  . Appendectomy    . Breast surgery Bilateral 10/06/13    mastectomy    Family History  Problem Relation Age of Onset  . Cancer Paternal Grandfather     cancer of the heel    Social History History  Substance Use Topics  . Smoking status: Never Smoker   . Smokeless tobacco: Never Used  . Alcohol Use: No    Allergies  Allergen Reactions  . Omeprazole   . Tape     Redness   . Sulfa Antibiotics Rash    Current Outpatient Prescriptions  Medication Sig Dispense Refill  . atorvastatin (LIPITOR) 10 MG tablet Take 10 mg by mouth daily.    . Cholecalciferol (VITAMIN D3) 1000 UNITS CAPS Take by mouth daily.    [name] esomeprazole (NEXIUM) 40 MG capsule Take 40 mg by mouth daily.    [name] levothyroxine (SYNTHROID, LEVOTHROID) 50 MCG tablet Take 50 mcg by mouth daily.    . Multiple Vitamin (MULTIVITAMIN) capsule Take 1  capsule by mouth daily.    . vitamin C (ASCORBIC ACID) 500 MG tablet Take 500 mg by mouth daily.    [name] azithromycin (ZITHROMAX Z-PAK) 250 MG tablet Take as directed 6 each 0   No current facility-administered medications for this visit.    Review of Systems Review of Systems  Constitutional: Negative.   Respiratory: Negative.   Cardiovascular: Negative.     Blood pressure 130/74, pulse 74, temperature 98.1 F (36.7 C), resp. rate 12, height _0  (1.651 m), weight 174 lb (78.926 kg).  Physical Exam Physical Exam  Constitutional: She is oriented to person, place, and time. She appears well-developed and well-nourished.  Eyes: Conjunctivae are normal. No scleral icterus.  Neck: Neck supple.  Cardiovascular: Normal rate, regular rhythm and normal heart sounds.   Pulmonary/Chest: Effort normal and breath sounds normal.  Bilateral mastectomy sites are well healed and no sign of infection.   Abdominal: Soft. Bowel sounds are normal. There is no hepatomegaly. There is no tenderness.  Musculoskeletal:  No evidence of lymphedema in arms  Lymphadenopathy:    She has no cervical adenopathy.    She has no axillary adenopathy.  Neurological: She is alert and oriented to  person, place, and time.  Skin: Skin is warm and dry.    Data Reviewed None  Assessment    CA breast, BRCA 1. S/P bilateral mastectomy. Stable exam. Pt is having sinus drainage. Will Rx with Z pak.     Plan   Rx given for breast prosthesis and surgical bras Patient to return in six month.     PCP:  [place] [facility] Cc: Forest Gleason, M.D. Morgan Flores G 11/23/2014, 9:39 AM

## 2014-11-23 NOTE — Patient Instructions (Signed)
Patient to return in six months  

## 2014-11-29 ENCOUNTER — Inpatient Hospital Stay: Payer: 59 | Attending: Obstetrics and Gynecology | Admitting: Obstetrics and Gynecology

## 2014-11-29 VITALS — BP 124/70 | HR 90 | Temp 96.8°F | Resp 18 | Ht 65.5 in | Wt 173.9 lb

## 2014-11-29 DIAGNOSIS — K219 Gastro-esophageal reflux disease without esophagitis: Secondary | ICD-10-CM | POA: Insufficient documentation

## 2014-11-29 DIAGNOSIS — Z90710 Acquired absence of both cervix and uterus: Secondary | ICD-10-CM | POA: Insufficient documentation

## 2014-11-29 DIAGNOSIS — E669 Obesity, unspecified: Secondary | ICD-10-CM | POA: Insufficient documentation

## 2014-11-29 DIAGNOSIS — N736 Female pelvic peritoneal adhesions (postinfective): Secondary | ICD-10-CM | POA: Insufficient documentation

## 2014-11-29 DIAGNOSIS — Z8541 Personal history of malignant neoplasm of cervix uteri: Secondary | ICD-10-CM | POA: Insufficient documentation

## 2014-11-29 DIAGNOSIS — Z923 Personal history of irradiation: Secondary | ICD-10-CM | POA: Diagnosis not present

## 2014-11-29 DIAGNOSIS — Z1501 Genetic susceptibility to malignant neoplasm of breast: Secondary | ICD-10-CM | POA: Diagnosis not present

## 2014-11-29 DIAGNOSIS — Z1509 Genetic susceptibility to other malignant neoplasm: Secondary | ICD-10-CM

## 2014-11-29 DIAGNOSIS — Z79899 Other long term (current) drug therapy: Secondary | ICD-10-CM | POA: Diagnosis not present

## 2014-11-29 DIAGNOSIS — Z853 Personal history of malignant neoplasm of breast: Secondary | ICD-10-CM | POA: Diagnosis not present

## 2014-11-29 DIAGNOSIS — K449 Diaphragmatic hernia without obstruction or gangrene: Secondary | ICD-10-CM | POA: Insufficient documentation

## 2014-11-29 DIAGNOSIS — Z9221 Personal history of antineoplastic chemotherapy: Secondary | ICD-10-CM | POA: Diagnosis not present

## 2014-11-29 NOTE — Progress Notes (Signed)
Gynecologic Oncology Interval Note  Chief Complaint: BRCA1 carrier for prophylactic surgery.  Subjective:  Morgan Flores is a 64 y.o. woman who presents today for continued management of BRCA1 mutation.    History:  Patient had breast cancer in 2009 treated with lumpectomy, chemo and RT. She was found to have a BRCA1 mutation in 12/14 and a bilateral mastectomy was planned and then she was found to have a second breast cancer and received chemo post op that ended in 7/15.   In view of the BRCA1 mutation, Dr Klitt attempted laparoscopic prophylactic BSO in 4/15. She saw significant pelvic adhesions, but no evidence of adnexal tissues. Bladder cystotomy repaired.  Patient saw Dr Miller last year and KUB showed ovaries with three clips each pexed up in the gutters. She had a radical hysterectomy for cervical adenocarcinoma in 1997 with negative nodes. Review of the op noted and path report shows that the fallopian tubes were not removed and the ovaries were pexed in the gutters bilaterally.    Problem List: Patient Active Problem List   Diagnosis Date Noted  . 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 ERneg, PR weakly pos, Her2 neg. 10/15/2013  . Personal history of malignant neoplasm of breast right breast, triple negative 07/05/2013  . BRCA1 genetic carrier 07/05/2013    Past Medical History: Past Medical History  Diagnosis Date  . Endocrine problem 2009    thyroid  . Personal history of malignant neoplasm of breast 2009    right breast  . Breast screening, unspecified   . Special screening for malignant neoplasms, colon   . Obesity, unspecified   . Hiatal hernia 2011  . Bladder infection   . Acid reflux 2006  . Cancer 1997    cervical    Past Surgical History: Past Surgical History  Procedure Laterality Date  . Portacath placement  2009,11/15/13  . Cesarean section  1983  . Abdominal hysterectomy  1997  . Port-a-cath removal   2012  . Colonoscopy  2011    Dr. Elliott  . Upper gi endoscopy  2011    Dr. Elliott  . Appendectomy    . Breast surgery Bilateral 10/06/13    mastectomy    Family History: Family History  Problem Relation Age of Onset  . Cancer Paternal Grandfather     cancer of the heel    Social History: History   Social History  . Marital Status: Married    Spouse Name: N/A  . Number of Children: N/A  . Years of Education: N/A   Occupational History  . Not on file.   Social History Main Topics  . Smoking status: Never Smoker   . Smokeless tobacco: Never Used  . Alcohol Use: No  . Drug Use: No  . Sexual Activity: Not on file   Other Topics Concern  . Not on file   Social History Narrative    Allergies: Allergies  Allergen Reactions  . Omeprazole   . Tape     Redness   . Sulfa Antibiotics Rash    Current Medications: Current Outpatient Prescriptions  Medication Sig Dispense Refill  . Cholecalciferol (VITAMIN D3) 1000 UNITS CAPS Take by mouth daily.    . esomeprazole (NEXIUM) 40 MG capsule Take 40 mg by mouth as needed.     . levothyroxine (SYNTHROID, LEVOTHROID) 50 MCG tablet Take 50 mcg by mouth daily.    . Multiple Vitamin (MULTIVITAMIN) capsule Take 1 capsule by mouth daily.    .   vitamin C (ASCORBIC ACID) 500 MG tablet Take 500 mg by mouth daily.    . atorvastatin (LIPITOR) 10 MG tablet Take 10 mg by mouth daily.    . azithromycin (ZITHROMAX Z-PAK) 250 MG tablet Take as directed (Patient not taking: Reported on 11/29/2014) 6 each 0   No current facility-administered medications for this visit.      Review of Systems Pertinent items are noted in HPI.  Objective:  BP 124/70 mmHg  Pulse 90  Temp(Src) 96.8 F (36 C) (Tympanic)  Resp 18  Ht 5' 5.5" (1.664 m)  Wt 173 lb 15.1 oz (78.9 kg)  BMI 28.50 kg/m2   General appearance: alert, cooperative and appears stated age HEENT: mucus membranes moist,PERRL,sclera clear Neck: supple, no thyroid enlargement or  cervical adenopathy Lymph node survey: normal Cardiovascular: regular rate and rhythm,without murmurs, rubs or gallops Respiratory: clear to auscultation Breast exam: deferred. Abdomen: no palpable masses,no hernias, bowel sounds present,without hepatosplenomegaly.  Old midline incision. Back: inspection of back is normal Extremities: no lower extremity edema Skin exam - normal coloration and turgor, no rashes, no suspicious skin lesions noted. Neurological exam reveals alert, oriented, normal speech, no focal findings or movement disorder noted".  Pelvic: deferred   Assessment:  Morgan Flores is a 64 y.o. female with a history of BRCA1 mutation.   History of breast and cervical cancer with adnexa pexed in the gutters.  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 Review of the op noted and path report shows that the fallopian tubes were not removed and the ovaries were pexed in the gutters bilaterally. The clips on the ovaries are seen on KUB.  Pros and cons of risk reducing BSO were discussed, questions answered. Prophylactic surgery is strongly recommended in view of the 40% life time risk of ovarian cancer. The patient knows that even if this can be accomplished there is a small risk of developing peritoneal cancer. A laparoscopic procedure is still probably feasible, however the patient knows, that there is an increased risk that a laparotomy will be necessary.   She will be posted for laparoscopic bilateral salpingo-oophorectomy, possible exploratory laparotomy, since the adnexa were pexed in the gutters and may be difficult to find. Dr Charles Pickens will assist with the surgery and date will be determined based on his schedule.  Risks including but not limited to:   Bleeding - possibly requiring transfusion  Infection- possibly requiring  antibiotics, drain placement, and/or opening of incision  Damage to nearby organs: bowel, bladder, blood vessels, nerves, ureters, uterus, vagina, cervix   Need for further surgery or re-exploration   Hernia or breakdown of incision  Delayed wound healing  Anesthesia risks  Medical complications: thromboembolic events (blood clot to lung, brain, legs), pneumonia, chronic pain, heart attack, stroke, death  Surgical menopause  Patient and husband are in agreement with proceeding with surgery.  Suggested follow up post op.   Berchuck, Andrew, MD  CC:  James Hedrick, MD [address] [facility] [place], [place] [address] [phone]  Dr. Charles Pickens 

## 2014-11-29 NOTE — H&P (Signed)
Gynecologic Oncology Interval Note  Chief Complaint: BRCA1 carrier for prophylactic surgery.  Subjective:  Morgan Flores is a 64 y.o. woman who presents today for continued management of BRCA1 mutation.    Patient had breast cancer in 2009 treated with lumpectomy, chemo and RT. She was found to have a BRCA1 mutation in 12/14 and a bilateral mastectomy was planned and then she was found to have a second breast cancer and received chemo post op that ended in 7/15.   In view of the BRCA1 mutation, Dr Danella Deis attempted laparoscopic prophylactic BSO in 4/15. She saw significant pelvic adhesions, but no evidence of adnexal tissues. Bladder cystotomy repaired.  Patient saw Dr Sabra Heck last year and KUB showed ovaries with three clips each pexed up in the gutters. She had a radical hysterectomy for cervical adenocarcinoma in 1997 with negative nodes. Review of the op noted and path report shows that the fallopian tubes were not removed and the ovaries were pexed in the gutters bilaterally.    History:   Problem List: Patient Active Problem List   Diagnosis Date Noted  . 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 ERneg, PR weakly pos, Her2 neg. 10/15/2013  . Personal history of malignant neoplasm of breast right breast, triple negative 07/05/2013  . BRCA1 genetic carrier 07/05/2013    Past Medical History: Past Medical History  Diagnosis Date  . Endocrine problem 2009    thyroid  . Personal history of malignant neoplasm of breast 2009    right breast  . Breast screening, unspecified   . Special screening for malignant neoplasms, colon   . Obesity, unspecified   . Hiatal hernia 2011  . Bladder infection   . Acid reflux 2006  . Cancer 1997    cervical    Past Surgical History: Past Surgical History  Procedure Laterality Date  . Portacath placement  2009,11/15/13  . Cesarean section  1983  . Abdominal hysterectomy  1997  . Port-a-cath removal   2012  . Colonoscopy  2011    Dr. Vira Agar  . Upper gi endoscopy  2011    Dr. Vira Agar  . Appendectomy    . Breast surgery Bilateral 10/06/13    mastectomy    Family History: Family History  Problem Relation Age of Onset  . Cancer Paternal Grandfather     cancer of the heel    Social History: History   Social History  . Marital Status: Married    Spouse Name: N/A  . Number of Children: N/A  . Years of Education: N/A   Occupational History  . Not on file.   Social History Main Topics  . Smoking status: Never Smoker   . Smokeless tobacco: Never Used  . Alcohol Use: No  . Drug Use: No  . Sexual Activity: Not on file   Other Topics Concern  . Not on file   Social History Narrative    Allergies: Allergies  Allergen Reactions  . Omeprazole   . Tape     Redness   . Sulfa Antibiotics Rash    Current Medications: Current Outpatient Prescriptions  Medication Sig Dispense Refill  . Cholecalciferol (VITAMIN D3) 1000 UNITS CAPS Take by mouth daily.    [name] esomeprazole (NEXIUM) 40 MG capsule Take 40 mg by mouth as needed.     [name] levothyroxine (SYNTHROID, LEVOTHROID) 50 MCG tablet Take 50 mcg by mouth daily.    . Multiple Vitamin (MULTIVITAMIN) capsule Take 1 capsule by mouth daily.    [name]  vitamin C (ASCORBIC ACID) 500 MG tablet Take 500 mg by mouth daily.    [name] atorvastatin (LIPITOR) 10 MG tablet Take 10 mg by mouth daily.    [name] azithromycin (ZITHROMAX Z-PAK) 250 MG tablet Take as directed (Patient not taking: Reported on 11/29/2014) 6 each 0   No current facility-administered medications for this visit.      Review of Systems Pertinent items are noted in HPI.  Objective:  BP 124/70 mmHg  Pulse 90  Temp(Src) 96.8 F (36 C) (Tympanic)  Resp 18  Ht 5' 5.5" (1.664 m)  Wt 173 lb 15.1 oz (78.9 kg)  BMI 28.50 kg/m2   General appearance: alert, cooperative and appears stated age HEENT: mucus membranes moist,PERRL,sclera clear Neck: supple, no thyroid enlargement or  cervical adenopathy Lymph node survey: normal Cardiovascular: regular rate and rhythm,without murmurs, rubs or gallops Respiratory: clear to auscultation Breast exam: deferred. Abdomen: no palpable masses,no hernias, bowel sounds present,without hepatosplenomegaly.  Old midline incision. Back: inspection of back is normal Extremities: no lower extremity edema Skin exam - normal coloration and turgor, no rashes, no suspicious skin lesions noted. Neurological exam reveals alert, oriented, normal speech, no focal findings or movement disorder noted".  Pelvic: deferred   Assessment:  Morgan Flores is a 64 y.o. female with a history of BRCA1 mutation.   History of breast and cervical cancer with adnexa pexed in the gutters.  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 Review of the op noted and path report shows that the fallopian tubes were not removed and the ovaries were pexed in the gutters bilaterally.   Pros and cons of risk reducing BSO were discussed, questions answered. Prophylactic surgery is strongly recommended in view of the 40% life time risk of ovarian cancer. The patient knows that even if this can be accomplished there is a small risk of developing peritoneal cancer. A laparoscopic procedure is still probably feasible, however the patient knows, that there is an increased risk that a laparotomy will be necessary.    Posted for Laparoscopic bilateral salpingo-oophorectomy.  Possible exploratory laparotomy, since the adnexa were pexed in the gutters. Dr Atlee Abide will assist with the surgery.   Risks including but not limited to:   Bleeding - possibly requiring transfusion  Infection- possibly requiring antibiotics, drain placement, and/or opening of incision  Damage to nearby organs: bowel, bladder, blood vessels, nerves,  ureters, uterus, vagina, cervix   Need for further surgery or re-exploration   Hernia or breakdown of incision  Delayed wound healing  Anesthesia risks  Medical complications: thromboembolic events (blood clot to lung, brain, legs), pneumonia, chronic pain, heart attack, stroke, death  Surgical menopause  Suggested follow up post op  [name], MD  CC:  Maryland Pink, MD [address] [place], [place] [address] [phone]

## 2014-11-30 ENCOUNTER — Telehealth: Payer: Self-pay | Admitting: *Deleted

## 2014-11-30 NOTE — Telephone Encounter (Signed)
Patient called to inquire about surgery date.  Surgery date will be posted for 12/20/14 with Dr. Fransisca Connors.  Dr. Glennon Mac from [facility] will be assisting.  Explained to patient that patient may receive a call from [facility] to arrange for an apt there.  I spoke with Izora Gala at [facility] today to confirm this surgery coordination.  I am waiting to hear back the surgery department for preadmit testing testing.  I will call patient back on Monday or Tuesday to confirm preop date.  My chart msg sent to patient.

## 2014-12-04 ENCOUNTER — Encounter: Payer: Self-pay | Admitting: *Deleted

## 2014-12-04 NOTE — Telephone Encounter (Signed)
Spoke with patient. Via telephone [phone]. Patient given instructions to attend preadmit testing apt on 12/11/14 at 1045am. Pt to report to the preadmit testing department in the [facility], second floor, [address]. Surgery date is on December 20, 2014. Pt grateful for the phone call.

## 2014-12-11 ENCOUNTER — Encounter
Admission: RE | Admit: 2014-12-11 | Discharge: 2014-12-11 | Disposition: A | Discharge status: Home / Self Care | Payer: 59 | Source: Ambulatory Visit | Attending: Obstetrics and Gynecology | Admitting: Obstetrics and Gynecology

## 2014-12-11 DIAGNOSIS — C50912 Malignant neoplasm of unspecified site of left female breast: Secondary | ICD-10-CM | POA: Insufficient documentation

## 2014-12-11 DIAGNOSIS — I10 Essential (primary) hypertension: Secondary | ICD-10-CM

## 2014-12-11 DIAGNOSIS — Z01812 Encounter for preprocedural laboratory examination: Secondary | ICD-10-CM | POA: Insufficient documentation

## 2014-12-11 DIAGNOSIS — Z01810 Encounter for preprocedural cardiovascular examination: Secondary | ICD-10-CM | POA: Insufficient documentation

## 2014-12-11 HISTORY — DX: Hypothyroidism, unspecified: E03.9

## 2014-12-11 HISTORY — DX: Anemia, unspecified: D64.9

## 2014-12-11 LAB — CBC WITH DIFFERENTIAL/PLATELET
BASOS PCT: 1 %
Basophils Absolute: 0 10*3/uL (ref 0–0.1)
EOS PCT: 2 %
Eosinophils Absolute: 0.1 10*3/uL (ref 0–0.7)
HEMATOCRIT: 33.5 % — AB (ref 35.0–47.0)
HEMOGLOBIN: 11 g/dL — AB (ref 12.0–16.0)
Lymphocytes Relative: 29 %
Lymphs Abs: 1.5 10*3/uL (ref 1.0–3.6)
MCH: 29.7 pg (ref 26.0–34.0)
MCHC: 32.8 g/dL (ref 32.0–36.0)
MCV: 90.7 fL (ref 80.0–100.0)
MONO ABS: 0.3 10*3/uL (ref 0.2–0.9)
Monocytes Relative: 6 %
NEUTROS ABS: 3.3 10*3/uL (ref 1.4–6.5)
Neutrophils Relative %: 62 %
Platelets: 210 10*3/uL (ref 150–440)
RBC: 3.69 MIL/uL — ABNORMAL LOW (ref 3.80–5.20)
RDW: 14.4 % (ref 11.5–14.5)
WBC: 5.4 10*3/uL (ref 3.6–11.0)

## 2014-12-11 LAB — COMPREHENSIVE METABOLIC PANEL
ALT: 15 U/L (ref 14–54)
AST: 19 U/L (ref 15–41)
Albumin: 4.5 g/dL (ref 3.5–5.0)
Alkaline Phosphatase: 84 U/L (ref 38–126)
Anion gap: 3 — ABNORMAL LOW (ref 5–15)
BUN: 20 mg/dL (ref 6–20)
CALCIUM: 9.3 mg/dL (ref 8.9–10.3)
CO2: 27 mmol/L (ref 22–32)
Chloride: 109 mmol/L (ref 101–111)
Creatinine, Ser: 0.85 mg/dL (ref 0.44–1.00)
GFR calc Af Amer: >60 mL/min (ref >60)
GFR calc non Af Amer: >60 mL/min (ref >60)
Glucose, Bld: 99 mg/dL (ref 65–99)
Potassium: 4.1 mmol/L (ref 3.5–5.1)
SODIUM: 139 mmol/L (ref 135–145)
TOTAL PROTEIN: 7.2 g/dL (ref 6.5–8.1)
Total Bilirubin: 0.9 mg/dL (ref 0.3–1.2)

## 2014-12-11 LAB — PROTIME-INR
INR: 0.9
Prothrombin Time: 12.4 seconds (ref 11.4–15.0)

## 2014-12-11 LAB — TYPE AND SCREEN
ABO/RH(D): A POS
Antibody Screen: NEGATIVE

## 2014-12-11 LAB — APTT: APTT: 28 s (ref 24–36)

## 2014-12-11 LAB — ABO/RH: ABO/RH(D): A POS

## 2014-12-11 NOTE — Patient Instructions (Signed)
  Your procedure is scheduled on: 12/20/14 Report to Day Surgery. To find out your arrival time please call [phone] between 1PM - 3PM on 12/19/14.  Remember: Instructions that are not followed completely may result in serious medical risk, up to and including death, or upon the discretion of your surgeon and anesthesiologist your surgery may need to be rescheduled.    ___x_ 1. Do not eat food or drink liquids after midnight. No gum chewing or hard candies.     __x__ 2. No Alcohol for 24 hours before or after surgery.   ____ 3. Bring all medications with you on the day of surgery if instructed.    ___x_ 4. Notify your doctor if there is any change in your medical condition     (cold, fever, infections).     Do not wear jewelry, make-up, hairpins, clips or nail polish.  Do not wear lotions, powders, or perfumes. You may wear deodorant.  Do not shave 48 hours prior to surgery. Men may shave face and neck.  Do not bring valuables to the hospital.    [facility] is not responsible for any belongings or valuables.               Contacts, dentures or bridgework may not be worn into surgery.  Leave your suitcase in the car. After surgery it may be brought to your room.  For patients admitted to the hospital, discharge time is determined by your                treatment team.   Patients discharged the day of surgery will not be allowed to drive home.   Please read over the following fact sheets that you were given:   Surgical Site Infection Prevention   ____ Take these medicines the morning of surgery with A SIP OF WATER:    1. nexium  2. levothyroxine  3.   4.  5.  6.  __x__ Fleet Enema (as directed) 1-2 hours before arrival for surgery  _x___ Use CHG Soap as directed  ____ Use inhalers on the day of surgery  ____ Stop metformin 2 days prior to surgery    ____ Take 1/2 of usual insulin dose the night before surgery and none on the morning of surgery.   ____ Stop  Coumadin/Plavix/aspirin on   ____ Stop Anti-inflammatories on    __x __ Stop supplements until after surgery.   Stop vitamin c now   ____ Bring C-Pap to the hospital.

## 2014-12-20 ENCOUNTER — Encounter: Payer: Self-pay | Admitting: *Deleted

## 2014-12-20 ENCOUNTER — Ambulatory Visit
Admission: RE | Admit: 2014-12-20 | Discharge: 2014-12-20 | Disposition: A | Discharge status: Home / Self Care | Payer: 59 | Source: Ambulatory Visit | Attending: Obstetrics and Gynecology | Admitting: Obstetrics and Gynecology

## 2014-12-20 ENCOUNTER — Ambulatory Visit: Payer: 59 | Admitting: Certified Registered Nurse Anesthetist

## 2014-12-20 ENCOUNTER — Encounter
Admission: RE | Disposition: A | Discharge status: Home / Self Care | Payer: Self-pay | Source: Ambulatory Visit | Attending: Obstetrics and Gynecology

## 2014-12-20 DIAGNOSIS — K449 Diaphragmatic hernia without obstruction or gangrene: Secondary | ICD-10-CM | POA: Diagnosis not present

## 2014-12-20 DIAGNOSIS — Z853 Personal history of malignant neoplasm of breast: Secondary | ICD-10-CM | POA: Diagnosis present

## 2014-12-20 DIAGNOSIS — N8329 Other ovarian cysts: Secondary | ICD-10-CM | POA: Insufficient documentation

## 2014-12-20 DIAGNOSIS — N736 Female pelvic peritoneal adhesions (postinfective): Secondary | ICD-10-CM | POA: Diagnosis not present

## 2014-12-20 DIAGNOSIS — N838 Other noninflammatory disorders of ovary, fallopian tube and broad ligament: Secondary | ICD-10-CM | POA: Insufficient documentation

## 2014-12-20 DIAGNOSIS — Z90710 Acquired absence of both cervix and uterus: Secondary | ICD-10-CM | POA: Insufficient documentation

## 2014-12-20 DIAGNOSIS — Z8541 Personal history of malignant neoplasm of cervix uteri: Secondary | ICD-10-CM | POA: Insufficient documentation

## 2014-12-20 DIAGNOSIS — Z1509 Genetic susceptibility to other malignant neoplasm: Secondary | ICD-10-CM

## 2014-12-20 DIAGNOSIS — E039 Hypothyroidism, unspecified: Secondary | ICD-10-CM | POA: Insufficient documentation

## 2014-12-20 DIAGNOSIS — Z6827 Body mass index (BMI) 27.0-27.9, adult: Secondary | ICD-10-CM | POA: Diagnosis not present

## 2014-12-20 DIAGNOSIS — E669 Obesity, unspecified: Secondary | ICD-10-CM | POA: Insufficient documentation

## 2014-12-20 DIAGNOSIS — Z1501 Genetic susceptibility to malignant neoplasm of breast: Secondary | ICD-10-CM | POA: Insufficient documentation

## 2014-12-20 DIAGNOSIS — Z9013 Acquired absence of bilateral breasts and nipples: Secondary | ICD-10-CM | POA: Diagnosis not present

## 2014-12-20 DIAGNOSIS — K219 Gastro-esophageal reflux disease without esophagitis: Secondary | ICD-10-CM | POA: Insufficient documentation

## 2014-12-20 HISTORY — PX: LAPAROSCOPIC BILATERAL SALPINGO OOPHERECTOMY: SHX5890

## 2014-12-20 HISTORY — PX: LAPAROTOMY: SHX154

## 2014-12-20 SURGERY — SALPINGO-OOPHORECTOMY, BILATERAL, LAPAROSCOPIC
Anesthesia: General | Wound class: Clean Contaminated

## 2014-12-20 MED ORDER — BUPIVACAINE HCL (PF) 0.5 % IJ SOLN
INTRAMUSCULAR | Status: AC
  Filled 2014-12-20: qty 30

## 2014-12-20 MED ORDER — ONDANSETRON HCL 4 MG/2ML IJ SOLN
INTRAMUSCULAR | Status: AC
  Filled 2014-12-20: qty 2

## 2014-12-20 MED ORDER — FENTANYL CITRATE (PF) 100 MCG/2ML IJ SOLN
INTRAMUSCULAR | Status: AC
  Administered 2014-12-20: 25 ug via INTRAVENOUS
  Filled 2014-12-20: qty 2

## 2014-12-20 MED ORDER — OXYCODONE-ACETAMINOPHEN 5-325 MG PO TABS
1.0000 | ORAL_TABLET | ORAL | Status: DC | PRN
  Administered 2014-12-20: 1 via ORAL

## 2014-12-20 MED ORDER — ACETAMINOPHEN 10 MG/ML IV SOLN
INTRAVENOUS | Status: DC | PRN
  Administered 2014-12-20: 1000 mg via INTRAVENOUS

## 2014-12-20 MED ORDER — FENTANYL CITRATE (PF) 100 MCG/2ML IJ SOLN
25.0000 ug | INTRAMUSCULAR | Status: AC | PRN
  Administered 2014-12-20 (×6): 25 ug via INTRAVENOUS

## 2014-12-20 MED ORDER — ONDANSETRON HCL 4 MG/2ML IJ SOLN
INTRAMUSCULAR | Status: DC | PRN
  Administered 2014-12-20: 4 mg via INTRAVENOUS

## 2014-12-20 MED ORDER — FENTANYL CITRATE (PF) 100 MCG/2ML IJ SOLN
INTRAMUSCULAR | Status: DC | PRN
  Administered 2014-12-20: 50 ug via INTRAVENOUS
  Administered 2014-12-20 (×2): 100 ug via INTRAVENOUS

## 2014-12-20 MED ORDER — MIDAZOLAM HCL 2 MG/2ML IJ SOLN
INTRAMUSCULAR | Status: DC | PRN
  Administered 2014-12-20: 2 mg via INTRAVENOUS

## 2014-12-20 MED ORDER — IBUPROFEN 600 MG PO TABS: 600.0000 mg | ORAL_TABLET | Freq: Four times a day (QID) | ORAL | Status: DC | PRN

## 2014-12-20 MED ORDER — OXYCODONE-ACETAMINOPHEN 5-325 MG PO TABS
ORAL_TABLET | ORAL | Status: AC
  Filled 2014-12-20: qty 1

## 2014-12-20 MED ORDER — ACETAMINOPHEN 10 MG/ML IV SOLN
INTRAVENOUS | Status: AC
  Filled 2014-12-20: qty 100

## 2014-12-20 MED ORDER — PROPOFOL 10 MG/ML IV BOLUS
INTRAVENOUS | Status: DC | PRN
  Administered 2014-12-20: 150 mg via INTRAVENOUS

## 2014-12-20 MED ORDER — CHLORHEXIDINE GLUCONATE 4 % EX LIQD: 1.0000 "application " | Freq: Once | CUTANEOUS | Status: DC

## 2014-12-20 MED ORDER — GLYCOPYRROLATE 0.2 MG/ML IJ SOLN
INTRAMUSCULAR | Status: DC | PRN
  Administered 2014-12-20: 0.6 mg via INTRAVENOUS

## 2014-12-20 MED ORDER — EPHEDRINE SULFATE 50 MG/ML IJ SOLN
INTRAMUSCULAR | Status: DC | PRN
  Administered 2014-12-20: 10 mg via INTRAVENOUS

## 2014-12-20 MED ORDER — NEOSTIGMINE METHYLSULFATE 10 MG/10ML IV SOLN
INTRAVENOUS | Status: DC | PRN
  Administered 2014-12-20: 4 mg via INTRAVENOUS

## 2014-12-20 MED ORDER — OXYCODONE-ACETAMINOPHEN 5-325 MG PO TABS: 2.0000 | ORAL_TABLET | ORAL | Status: DC | PRN

## 2014-12-20 MED ORDER — FLEET ENEMA 7-19 GM/118ML RE ENEM: 1.0000 | ENEMA | Freq: Once | RECTAL | Status: DC

## 2014-12-20 MED ORDER — LIDOCAINE HCL (CARDIAC) 20 MG/ML IV SOLN
INTRAVENOUS | Status: DC | PRN
  Administered 2014-12-20: 50 mg via INTRAVENOUS

## 2014-12-20 MED ORDER — THROMBIN 5000 UNITS EX SOLR
CUTANEOUS | Status: DC | PRN
  Administered 2014-12-20: 5000 [IU] via TOPICAL

## 2014-12-20 MED ORDER — ONDANSETRON HCL 4 MG/2ML IJ SOLN
4.0000 mg | Freq: Once | INTRAMUSCULAR | Status: AC | PRN
  Administered 2014-12-20: 4 mg via INTRAVENOUS

## 2014-12-20 MED ORDER — THROMBIN 5000 UNITS EX SOLR
CUTANEOUS | Status: AC
  Filled 2014-12-20: qty 5000

## 2014-12-20 MED ORDER — DEXAMETHASONE SODIUM PHOSPHATE 4 MG/ML IJ SOLN
INTRAMUSCULAR | Status: DC | PRN
  Administered 2014-12-20: 10 mg via INTRAVENOUS

## 2014-12-20 MED ORDER — ROCURONIUM BROMIDE 100 MG/10ML IV SOLN
INTRAVENOUS | Status: DC | PRN
  Administered 2014-12-20: 40 mg via INTRAVENOUS

## 2014-12-20 MED ORDER — LACTATED RINGERS IV SOLN
INTRAVENOUS | Status: DC
Start: 2014-12-20 — End: 2014-12-20
  Administered 2014-12-20 (×3): via INTRAVENOUS

## 2014-12-20 SURGICAL SUPPLY — 80 items
APPLICATOR SURGIFLO (MISCELLANEOUS) ×8 IMPLANT
BAG URO DRAIN 2000ML W/SPOUT (MISCELLANEOUS) ×4 IMPLANT
BARRIER ADHS 3X4 INTERCEED (GAUZE/BANDAGES/DRESSINGS) IMPLANT
BLADE SURG 15 STRL SS SAFETY (BLADE) IMPLANT
BLADE SURG SZ11 CARB STEEL (BLADE) ×4 IMPLANT
CANISTER SUCT 1200ML W/VALVE (MISCELLANEOUS) ×4 IMPLANT
CANNULA DILATOR  5MM W/SLV (CANNULA) ×2
CANNULA DILATOR 5 W/SLV (CANNULA) ×6 IMPLANT
CATH FOL 2WAY LX 16X5 (CATHETERS) ×4 IMPLANT
CATH FOLEY 2WAY  5CC 16FR (CATHETERS)
CATH TRAY 16F METER LATEX (MISCELLANEOUS) IMPLANT
CATH URTH 16FR FL 2W BLN LF (CATHETERS) IMPLANT
CHLORAPREP W/TINT 26ML (MISCELLANEOUS) ×4 IMPLANT
CNTNR SPEC 2.5X3XGRAD LEK (MISCELLANEOUS) ×2
CONT SPEC 4OZ STER OR WHT (MISCELLANEOUS) ×2
CONTAINER SPEC 2.5X3XGRAD LEK (MISCELLANEOUS) ×2 IMPLANT
DRAPE LAP W/FLUID (DRAPES) IMPLANT
DRAPE LAPAROTOMY 100X77 ABD (DRAPES) IMPLANT
DRAPE LAPAROTOMY TRNSV 106X77 (MISCELLANEOUS) IMPLANT
DRAPE PERI LITHO V/GYN (MISCELLANEOUS) IMPLANT
DRAPE UNDER BUTTOCK W/FLU (DRAPES) ×4 IMPLANT
DRSG TEGADERM 2-3/8X2-3/4 SM (GAUZE/BANDAGES/DRESSINGS) IMPLANT
DRSG TELFA 3X8 NADH (GAUZE/BANDAGES/DRESSINGS) IMPLANT
ELECT BLADE 6 FLAT ULTRCLN (ELECTRODE) IMPLANT
ELECT CAUTERY BLADE 6.4 (BLADE) IMPLANT
ENDOPOUCH RETRIEVER 10 (MISCELLANEOUS) IMPLANT
GAUZE SPONGE 4X4 12PLY STRL (GAUZE/BANDAGES/DRESSINGS) IMPLANT
GLOVE BIO SURGEON STRL SZ 6.5 (GLOVE) ×12 IMPLANT
GLOVE BIO SURGEON STRL SZ8 (GLOVE) ×16 IMPLANT
GLOVE BIO SURGEONS STRL SZ 6.5 (GLOVE) ×4
GLOVE INDICATOR 8.0 STRL GRN (GLOVE) ×4 IMPLANT
GOWN STRL REUS W/ TWL LRG LVL3 (GOWN DISPOSABLE) ×4 IMPLANT
GOWN STRL REUS W/ TWL XL LVL3 (GOWN DISPOSABLE) ×2 IMPLANT
GOWN STRL REUS W/TWL LRG LVL3 (GOWN DISPOSABLE) ×4
GOWN STRL REUS W/TWL XL LVL3 (GOWN DISPOSABLE) ×2
GRASPER SUT TROCAR 14GX15 (MISCELLANEOUS) IMPLANT
IRRIGATION STRYKERFLOW (MISCELLANEOUS) ×2 IMPLANT
IRRIGATOR STRYKERFLOW (MISCELLANEOUS) ×4
IV LACTATED RINGERS 1000ML (IV SOLUTION) ×4 IMPLANT
KIT RM TURNOVER CYSTO AR (KITS) ×4 IMPLANT
LABEL OR SOLS (LABEL) ×4 IMPLANT
LIGASURE BLUNT 5MM 37CM (INSTRUMENTS) ×4 IMPLANT
LIGASURE IMPACT 36 18CM CVD LR (INSTRUMENTS) ×4 IMPLANT
LIQUID BAND (GAUZE/BANDAGES/DRESSINGS) ×4 IMPLANT
NDL INSUFF ACCESS 14 VERSASTEP (NEEDLE) ×4 IMPLANT
NS IRRIG 1000ML POUR BTL (IV SOLUTION) ×4 IMPLANT
NS IRRIG 500ML POUR BTL (IV SOLUTION) ×4 IMPLANT
PACK BASIN MAJOR ARMC (MISCELLANEOUS) ×4 IMPLANT
PACK GYN LAPAROSCOPIC (MISCELLANEOUS) ×4 IMPLANT
PAD GROUND ADULT SPLIT (MISCELLANEOUS) ×4 IMPLANT
PAD OB MATERNITY 4.3X12.25 (PERSONAL CARE ITEMS) ×4 IMPLANT
PAD PREP 24X41 OB/GYN DISP (PERSONAL CARE ITEMS) IMPLANT
PENCIL ELECTRO HAND CTR (MISCELLANEOUS) IMPLANT
POUCH ENDO CATCH 10MM SPEC (MISCELLANEOUS) ×4 IMPLANT
SET CYSTO W/LG BORE CLAMP LF (SET/KITS/TRAYS/PACK) IMPLANT
SHEARS ENDO 5MM 31CM (CUTTER) ×4 IMPLANT
SHEARS HARMONIC ACE PLUS 36CM (ENDOMECHANICALS) IMPLANT
SLEEVE ENDOPATH XCEL 5M (ENDOMECHANICALS) IMPLANT
SPOGE SURGIFLO 8M (HEMOSTASIS) ×2
SPONGE LAP 18X18 5 PK (GAUZE/BANDAGES/DRESSINGS) IMPLANT
SPONGE SURGIFLO 8M (HEMOSTASIS) ×2 IMPLANT
STAPLER SKIN PROX 35W (STAPLE) IMPLANT
STRAP SAFETY BODY (MISCELLANEOUS) ×4 IMPLANT
SUT MAXON ABS #0 GS21 30IN (SUTURE) IMPLANT
SUT PDS AB 1 TP1 96 (SUTURE) IMPLANT
SUT PLAIN 2 0 XLH (SUTURE) IMPLANT
SUT VIC AB 2-0 UR6 27 (SUTURE) IMPLANT
SUT VIC AB 4-0 FS2 27 (SUTURE) ×80 IMPLANT
SUT VICRYL 0 AB UR-6 (SUTURE) ×4 IMPLANT
SYR 30ML LL (SYRINGE) ×4 IMPLANT
SYRINGE 10CC LL (SYRINGE) ×4 IMPLANT
TRAY PREP VAG/GEN (MISCELLANEOUS) IMPLANT
TROCAR BLUNT TIP 12MM OMST12BT (TROCAR) ×8 IMPLANT
TROCAR ENDO BLADELESS 11MM (ENDOMECHANICALS) IMPLANT
TROCAR VERSASTEP 12M LG PL (TROCAR) ×4 IMPLANT
TROCAR XCEL NON-BLD 11X100MML (ENDOMECHANICALS) IMPLANT
TROCAR XCEL NON-BLD 5MMX100MML (ENDOMECHANICALS) IMPLANT
TROCAR XCEL UNIV SLVE 11M 100M (ENDOMECHANICALS) IMPLANT
TUBING INSUFFLATOR HEATED (MISCELLANEOUS) ×4 IMPLANT
TUBING INSUFFLATOR HI FLOW (MISCELLANEOUS) IMPLANT

## 2014-12-20 NOTE — Anesthesia Postprocedure Evaluation (Signed)
  Anesthesia Post-op Note  Patient: Morgan Flores  Procedure(s) Performed: Procedure(s): LAPAROSCOPIC BILATERAL SALPINGO OOPHORECTOMY (Bilateral) LAPAROTOMY (N/A)  Anesthesia type:General  Patient location: PACU  Post pain: Pain level controlled  Post assessment: Post-op Vital signs reviewed, Patient's Cardiovascular Status Stable, Respiratory Function Stable, Patent Airway and No signs of Nausea or vomiting  Post vital signs: Reviewed and stable  Last Vitals:  Filed Vitals:   12/20/14 1300  BP: 119/63  Pulse:   Temp: 36.9 C  Resp: 16    Level of consciousness: awake, alert  and patient cooperative  Complications: No apparent anesthesia complications

## 2014-12-20 NOTE — H&P (View-Only) (Signed)
Gynecologic Oncology Interval Note  Chief Complaint: BRCA1 carrier for prophylactic surgery.  Subjective:  Morgan Flores is a 64 y.o. woman who presents today for continued management of BRCA1 mutation.    Patient had breast cancer in 2009 treated with lumpectomy, chemo and RT. She was found to have a BRCA1 mutation in 12/14 and a bilateral mastectomy was planned and then she was found to have a second breast cancer and received chemo post op that ended in 7/15.   In view of the BRCA1 mutation, Dr Danella Deis attempted laparoscopic prophylactic BSO in 4/15. She saw significant pelvic adhesions, but no evidence of adnexal tissues. Bladder cystotomy repaired.  Patient saw Dr Sabra Heck last year and KUB showed ovaries with three clips each pexed up in the gutters. She had a radical hysterectomy for cervical adenocarcinoma in 1997 with negative nodes. Review of the op noted and path report shows that the fallopian tubes were not removed and the ovaries were pexed in the gutters bilaterally.    History:   Problem List: Patient Active Problem List   Diagnosis Date Noted  . 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 ERneg, PR weakly pos, Her2 neg. 10/15/2013  . Personal history of malignant neoplasm of breast right breast, triple negative 07/05/2013  . BRCA1 genetic carrier 07/05/2013    Past Medical History: Past Medical History  Diagnosis Date  . Endocrine problem 2009    thyroid  . Personal history of malignant neoplasm of breast 2009    right breast  . Breast screening, unspecified   . Special screening for malignant neoplasms, colon   . Obesity, unspecified   . Hiatal hernia 2011  . Bladder infection   . Acid reflux 2006  . Cancer 1997    cervical    Past Surgical History: Past Surgical History  Procedure Laterality Date  . Portacath placement  2009,11/15/13  . Cesarean section  1983  . Abdominal hysterectomy  1997  . Port-a-cath removal   2012  . Colonoscopy  2011    Dr. Vira Agar  . Upper gi endoscopy  2011    Dr. Vira Agar  . Appendectomy    . Breast surgery Bilateral 10/06/13    mastectomy    Family History: Family History  Problem Relation Age of Onset  . Cancer Paternal Grandfather     cancer of the heel    Social History: History   Social History  . Marital Status: Married    Spouse Name: N/A  . Number of Children: N/A  . Years of Education: N/A   Occupational History  . Not on file.   Social History Main Topics  . Smoking status: Never Smoker   . Smokeless tobacco: Never Used  . Alcohol Use: No  . Drug Use: No  . Sexual Activity: Not on file   Other Topics Concern  . Not on file   Social History Narrative    Allergies: Allergies  Allergen Reactions  . Omeprazole   . Tape     Redness   . Sulfa Antibiotics Rash    Current Medications: Current Outpatient Prescriptions  Medication Sig Dispense Refill  . Cholecalciferol (VITAMIN D3) 1000 UNITS CAPS Take by mouth daily.    [name] esomeprazole (NEXIUM) 40 MG capsule Take 40 mg by mouth as needed.     [name] levothyroxine (SYNTHROID, LEVOTHROID) 50 MCG tablet Take 50 mcg by mouth daily.    . Multiple Vitamin (MULTIVITAMIN) capsule Take 1 capsule by mouth daily.    [name]  vitamin C (ASCORBIC ACID) 500 MG tablet Take 500 mg by mouth daily.    [name] atorvastatin (LIPITOR) 10 MG tablet Take 10 mg by mouth daily.    [name] azithromycin (ZITHROMAX Z-PAK) 250 MG tablet Take as directed (Patient not taking: Reported on 11/29/2014) 6 each 0   No current facility-administered medications for this visit.      Review of Systems Pertinent items are noted in HPI.  Objective:  BP 124/70 mmHg  Pulse 90  Temp(Src) 96.8 F (36 C) (Tympanic)  Resp 18  Ht 5' 5.5" (1.664 m)  Wt 173 lb 15.1 oz (78.9 kg)  BMI 28.50 kg/m2   General appearance: alert, cooperative and appears stated age HEENT: mucus membranes moist,PERRL,sclera clear Neck: supple, no thyroid enlargement or  cervical adenopathy Lymph node survey: normal Cardiovascular: regular rate and rhythm,without murmurs, rubs or gallops Respiratory: clear to auscultation Breast exam: deferred. Abdomen: no palpable masses,no hernias, bowel sounds present,without hepatosplenomegaly.  Old midline incision. Back: inspection of back is normal Extremities: no lower extremity edema Skin exam - normal coloration and turgor, no rashes, no suspicious skin lesions noted. Neurological exam reveals alert, oriented, normal speech, no focal findings or movement disorder noted".  Pelvic: deferred   Assessment:  Morgan Flores is a 64 y.o. female with a history of BRCA1 mutation.   History of breast and cervical cancer with adnexa pexed in the gutters.  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 Review of the op noted and path report shows that the fallopian tubes were not removed and the ovaries were pexed in the gutters bilaterally.   Pros and cons of risk reducing BSO were discussed, questions answered. Prophylactic surgery is strongly recommended in view of the 40% life time risk of ovarian cancer. The patient knows that even if this can be accomplished there is a small risk of developing peritoneal cancer. A laparoscopic procedure is still probably feasible, however the patient knows, that there is an increased risk that a laparotomy will be necessary.    Posted for Laparoscopic bilateral salpingo-oophorectomy.  Possible exploratory laparotomy, since the adnexa were pexed in the gutters. Dr Atlee Abide will assist with the surgery.   Risks including but not limited to:   Bleeding - possibly requiring transfusion  Infection- possibly requiring antibiotics, drain placement, and/or opening of incision  Damage to nearby organs: bowel, bladder, blood vessels, nerves,  ureters, uterus, vagina, cervix   Need for further surgery or re-exploration   Hernia or breakdown of incision  Delayed wound healing  Anesthesia risks  Medical complications: thromboembolic events (blood clot to lung, brain, legs), pneumonia, chronic pain, heart attack, stroke, death  Surgical menopause  Suggested follow up post op  [name], MD  CC:  Mary[place] Pink, MD [address] [place], [place] [address] [phone]

## 2014-12-20 NOTE — Anesthesia Preprocedure Evaluation (Addendum)
Anesthesia Evaluation  Patient identified by MRN, date of birth, ID band Patient awake    Reviewed: Allergy & Precautions, NPO status , Patient's Chart, lab work & pertinent test results  History of Anesthesia Complications (+) POST - OP SPINAL HEADACHE and history of anesthetic complications  Airway Mallampati: II       Dental no notable dental hx.    Pulmonary neg pulmonary ROS,    Pulmonary exam normal       Cardiovascular negative cardio ROS Normal cardiovascular exam    Neuro/Psych Anxiety negative neurological ROS     GI/Hepatic Neg liver ROS, GERD-  Medicated,  Endo/Other  Hypothyroidism   Renal/GU negative Renal ROS  negative genitourinary   Musculoskeletal negative musculoskeletal ROS (+)   Abdominal Normal abdominal exam  (+)   Peds negative pediatric ROS (+)  Hematology  (+) anemia ,   Anesthesia Other Findings   Reproductive/Obstetrics negative OB ROS                            Anesthesia Physical Anesthesia Plan  ASA: II  Anesthesia Plan: General   Post-op Pain Management:    Induction:   Airway Management Planned: Oral ETT  Additional Equipment:   Intra-op Plan:   Post-operative Plan: Extubation in OR  Informed Consent: I have reviewed the patients History and Physical, chart, labs and discussed the procedure including the risks, benefits and alternatives for the proposed anesthesia with the patient or authorized representative who has indicated his/her understanding and acceptance.     Plan Discussed with: CRNA  Anesthesia Plan Comments:         Anesthesia Quick Evaluation

## 2014-12-20 NOTE — Interval H&P Note (Signed)
History and Physical Interval Note:  12/20/2014 10:46 AM  Morgan Flores  has presented today for surgery, with the diagnosis of BRACA1 mutation.  The various methods of treatment have been discussed with the patient and family. After consideration of risks, benefits and other options for treatment, the patient has consented to LS BSO as a surgical intervention.  We discussed the potential need for laparotomy and potential complications including bowel injury with the patient and husband. The patient's history has been reviewed, patient examined, no change in status, stable for surgery.  I have reviewed the patient's chart and labs.  Questions were answered to the patient's satisfaction.   [name]

## 2014-12-20 NOTE — Anesthesia Postprocedure Evaluation (Signed)
  Anesthesia Post-op Note  Patient: Morgan Flores  Procedure(s) Performed: Procedure(s): LAPAROSCOPIC BILATERAL SALPINGO OOPHORECTOMY (Bilateral) LAPAROTOMY (N/A)  Anesthesia type:General  Patient location: PACU  Post pain: Pain level controlled  Post assessment: Post-op Vital signs reviewed, Patient's Cardiovascular Status Stable, Respiratory Function Stable, Patent Airway and No signs of Nausea or vomiting  Post vital signs: Reviewed and stable  Last Vitals:  Filed Vitals:   12/20/14 1329  BP: 110/61  Pulse: 76  Temp:   Resp: 12    Level of consciousness: awake, alert  and patient cooperative  Complications: No apparent anesthesia complications

## 2014-12-20 NOTE — Transfer of Care (Signed)
Immediate Anesthesia Transfer of Care Note  Patient: Morgan Flores  Procedure(s) Performed: Procedure(s): LAPAROSCOPIC BILATERAL SALPINGO OOPHORECTOMY (Bilateral) LAPAROTOMY (N/A)  Patient Location: PACU  Anesthesia Type:General  Level of Consciousness: awake  Airway & Oxygen Therapy: Patient Spontanous Breathing  Post-op Assessment: Report given to RN  Post vital signs: stable  Last Vitals:  Filed Vitals:   12/20/14 1300  BP: 119/63  Pulse:   Temp: 36.9 C  Resp: 16    Complications: No apparent anesthesia complications

## 2014-12-20 NOTE — Discharge Instructions (Addendum)
AMBULATORY SURGERY  DISCHARGE INSTRUCTIONS   1) The drugs that you were given will stay in your system until tomorrow so for the next 24 hours you should not:  A) Drive an automobile B) Make any legal decisions C) Drink any alcoholic beverage   2) You may resume regular meals tomorrow.  Today it is better to start with liquids and gradually work up to solid foods.  You may eat anything you prefer, but it is better to start with liquids, then soup and crackers, and gradually work up to solid foods.   3) Please notify your doctor immediately if you have any unusual bleeding, trouble breathing, redness and pain at the surgery site, drainage, fever, or pain not relieved by medication. 4)   5) Your post-operative visit with Dr.    George Flores                                 is: Date:                        Time:    Please call to schedule your post-operative visit.  6) Additional Instructions: Diagnostic Laparoscopy Laparoscopy is a surgical procedure. It is used to diagnose and treat diseases inside the belly (abdomen). It is usually a brief, common, and relatively simple procedure. The laparoscopeis a thin, lighted, pencil-sized instrument. It is like a telescope. It is inserted into your abdomen through a small cut (incision). Your caregiver can look at the organs inside your body through this instrument. He or she can see if there is anything abnormal. Laparoscopy can be done either in a hospital or outpatient clinic. You may be given a mild sedative to help you relax before the procedure. Once in the operating room, you will be given a drug to make you sleep (general anesthesia). Laparoscopy usually lasts less than 1 hour. After the procedure, you will be monitored in a recovery area until you are stable and doing well. Once you are home, it will take 2 to 3 days to fully recover. RISKS AND COMPLICATIONS  Laparoscopy has relatively few risks. Your caregiver will discuss the risks with you  before the procedure. Some problems that can occur include: Infection. Bleeding. Damage to other organs. Anesthetic side effects. PROCEDURE Once you receive anesthesia, your surgeon inflates the abdomen with a harmless gas (carbon dioxide). This makes the organs easier to see. The laparoscope is inserted into the abdomen through a small incision. This allows your surgeon to see into the abdomen. Other small instruments are also inserted into the abdomen through other small openings. Many surgeons attach a video camera to the laparoscope to enlarge the view. During a diagnostic laparoscopy, the surgeon may be looking for inflammation, infection, or cancer. Your surgeon may take tissue samples(biopsies). The samples are sent to a specialist in looking at cells and tissue samples (pathologist). The pathologist examines them under a microscope. Biopsies can help to diagnose or confirm a disease. AFTER THE PROCEDURE  The gas is released from inside the abdomen. The incisions are closed with stitches (sutures). Because these incisions are small (usually less than 1/2 inch), there is usually minimal discomfort after the procedure. There may be some mild discomfort in the throat. This is from the tube placed in the throat while you were sleeping. You may have some mild abdominal discomfort. There may also be discomfort from the instrument placement incisions  in the abdomen. The recovery time is shortened as long as there are no complications. You will rest in a recovery room until stable and doing well. As long as there are no complications, you may be allowed to go home. FINDING OUT THE RESULTS OF YOUR TEST Not all test results are available during your visit. If your test results are not back during the visit, make an appointment with your caregiver to find out the results. Do not assume everything is normal if you have not heard from your caregiver or the medical facility. It is important for you to follow  up on all of your test results. HOME CARE INSTRUCTIONS  Take all medicines as directed. Only take over-the-counter or prescription medicines for pain, discomfort, or fever as directed by your caregiver. Resume daily activities as directed. Showers are preferred over baths. You may resume sexual activities in 1 week or as directed. Do not drive while taking narcotics. SEEK MEDICAL CARE IF:  There is increasing abdominal pain. There is new pain in the shoulders (shoulder strap areas). You feel lightheaded or faint. You have the chills. You or your child has an oral temperature above 102 F (38.9 C). There is pus-like (purulent) drainage from any of the wounds. You are unable to pass gas or have a bowel movement. You feel sick to your stomach (nauseous) or throw up (vomit). MAKE SURE YOU:  Understand these instructions. Will watch your condition. Will get help right away if you are not doing well or get worse. Document Released: 09/22/2000 Document Revised: 10/11/2012 Document Reviewed: 06/16/2007 [facility] Patient Information 2015 [place], [place]. This information is not intended to replace advice given to you by your health care provider. Make sure you discuss any questions you have with your health care provider.

## 2014-12-20 NOTE — Anesthesia Procedure Notes (Signed)
Procedure Name: Intubation Performed by: Sabastien Tyler Pre-anesthesia Checklist: Patient identified, Patient being monitored, Timeout performed, Emergency Drugs available and Suction available Patient Re-evaluated:Patient Re-evaluated prior to inductionOxygen Delivery Method: Circle system utilized Preoxygenation: Pre-oxygenation with 100% oxygen Intubation Type: IV induction Ventilation: Mask ventilation without difficulty Laryngoscope Size: Mac and 3 Grade View: Grade I Tube type: Oral Tube size: 7.0 mm Number of attempts: 1 Airway Equipment and Method: Stylet Placement Confirmation: ETT inserted through vocal cords under direct vision,  positive ETCO2 and breath sounds checked- equal and bilateral Secured at: 21 cm Tube secured with: Tape Dental Injury: Teeth and Oropharynx as per pre-operative assessment        

## 2014-12-20 NOTE — Op Note (Signed)
  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  ESTIMATED BLOOD LOSS: less than 50 mL  DRAINS: none  TOTAL IV FLUIDS: 1200 ml  SPECIMENS: Bilateral fallopian tubes and ovaries  COMPLICATIONS: none  DISPOSITION: PACU - hemodynamically stable.  CONDITION: good  INDICATIONS: BRCA1 mutation carrier.  FINDINGS: The bilateral tubes and ovaries were found in the high lateral gutters.  They appeared normal.  Washings not done because the adnexa were behind the colon. Survey of the rest of the abdomen was normal.  There were filmy adhesions of the omentum that were taken down easily with the Ligasure.  PROCEDURE IN DETAIL: After informed consent was obtained, the patient was taken to the operating room where anesthesia was obtained without difficulty. The patient was positioned in the dorsal lithotomy position in [place] and her arms were carefully tucked at her sides and the usual precautions were taken.  She was prepped and draped in normal sterile fashion.  Time-out was performed and a Foley catheter was placed into the bladder.    An open Hasson technique was used to place an infraumbilical 39-SU baloon trocar under direct visualization. The laparoscope was introduced and CO2 gas was infused for pneumoperitoneum to a pressure of 15 mm Hg.  Right and left lateral 5-mm ports and a 5-12 mm suprapubic port were placed under direct visualization of the laparoscope using an EndoStep technique.  The patient was placed in Trendelenburg and the bowel was displaced up into the upper abdomen.  The adnexa were carefully freed from the ascending and descending colon and gutters with  the endoshears.   The infundibulopelvic ligaments were skeletonized, sealed and divided with the LigaSure device.    Hemostasis was observed. The intraperitoneal pressure was dropped, and all planes of dissection, vascular pedicles were found to be hemostatic.  Surgiflow was placed in the dissection beds bilaterally.  The suprapubic trocar was removed and the fascia was closed with 0 Vicryl suture using the Endoclose technique. The lateral trocars were removed under visualization.   Before the umbilical trocar was removed the CO2 gas was released.  The fascia there was closed with 0 Vicryl suture in interrupted technique.   The skin incision above the umbilicus and the suprapubic incision were closed with subcuticular stitches.  The remaining skin incisions were closed with Indermil glue.  The patient tolerated the procedure well.  Sponge, lap and needle counts were correct x2.  The patient was taken to recovery room in excellent condition.  VTE prophylaxis: was ordered perioperatively.  [name], MD

## 2014-12-21 ENCOUNTER — Encounter: Payer: Self-pay | Admitting: Obstetrics and Gynecology

## 2014-12-25 LAB — SURGICAL PATHOLOGY

## 2014-12-28 ENCOUNTER — Telehealth: Payer: Self-pay | Admitting: *Deleted

## 2014-12-28 NOTE — Telephone Encounter (Signed)
Spoke with patient. Discussed patient's pathology results with patient. The patient will be seen at [place] with Dr. Glennon Mac.  At this time, no gyn oncology f/u is needed. Pt's final pathology did not demonstrate any signs of cancer. Pt states expressed gratitude for the call.

## 2015-01-04 ENCOUNTER — Ambulatory Visit: Payer: Self-pay | Admitting: Oncology

## 2015-01-05 ENCOUNTER — Encounter: Payer: Self-pay | Admitting: Oncology

## 2015-01-11 ENCOUNTER — Ambulatory Visit: Payer: Self-pay | Admitting: Oncology

## 2015-01-16 ENCOUNTER — Inpatient Hospital Stay: Payer: 59 | Attending: Oncology | Admitting: Oncology

## 2015-01-16 ENCOUNTER — Inpatient Hospital Stay: Payer: 59

## 2015-01-16 VITALS — BP 113/67 | HR 80 | Temp 96.7°F | Wt 173.9 lb

## 2015-01-16 DIAGNOSIS — D649 Anemia, unspecified: Secondary | ICD-10-CM | POA: Insufficient documentation

## 2015-01-16 DIAGNOSIS — Z9221 Personal history of antineoplastic chemotherapy: Secondary | ICD-10-CM | POA: Diagnosis not present

## 2015-01-16 DIAGNOSIS — Z79899 Other long term (current) drug therapy: Secondary | ICD-10-CM | POA: Insufficient documentation

## 2015-01-16 DIAGNOSIS — Z853 Personal history of malignant neoplasm of breast: Secondary | ICD-10-CM | POA: Diagnosis not present

## 2015-01-16 DIAGNOSIS — Z90722 Acquired absence of ovaries, bilateral: Secondary | ICD-10-CM | POA: Insufficient documentation

## 2015-01-16 DIAGNOSIS — C801 Malignant (primary) neoplasm, unspecified: Secondary | ICD-10-CM

## 2015-01-16 DIAGNOSIS — E039 Hypothyroidism, unspecified: Secondary | ICD-10-CM | POA: Diagnosis not present

## 2015-01-16 DIAGNOSIS — Z9012 Acquired absence of left breast and nipple: Secondary | ICD-10-CM | POA: Diagnosis not present

## 2015-01-16 DIAGNOSIS — K219 Gastro-esophageal reflux disease without esophagitis: Secondary | ICD-10-CM | POA: Insufficient documentation

## 2015-01-16 DIAGNOSIS — E079 Disorder of thyroid, unspecified: Secondary | ICD-10-CM

## 2015-01-16 DIAGNOSIS — E669 Obesity, unspecified: Secondary | ICD-10-CM | POA: Insufficient documentation

## 2015-01-16 DIAGNOSIS — C50912 Malignant neoplasm of unspecified site of left female breast: Secondary | ICD-10-CM

## 2015-01-16 DIAGNOSIS — C50911 Malignant neoplasm of unspecified site of right female breast: Secondary | ICD-10-CM

## 2015-01-16 MED ORDER — HEPARIN SOD (PORK) LOCK FLUSH 100 UNIT/ML IV SOLN
500.0000 [IU] | Freq: Once | INTRAVENOUS | Status: AC
  Administered 2015-01-16: 500 [IU] via INTRAVENOUS

## 2015-01-16 MED ORDER — HEPARIN SOD (PORK) LOCK FLUSH 100 UNIT/ML IV SOLN
INTRAVENOUS | Status: AC
  Filled 2015-01-16: qty 5

## 2015-01-16 MED ORDER — SODIUM CHLORIDE 0.9 % IJ SOLN
10.0000 mL | INTRAMUSCULAR | Status: AC | PRN
  Administered 2015-01-16: 10 mL via INTRAVENOUS
  Filled 2015-01-16: qty 10

## 2015-01-16 NOTE — Progress Notes (Signed)
Patient does have living will. Never smoked. 

## 2015-01-20 ENCOUNTER — Encounter: Payer: Self-pay | Admitting: Oncology

## 2015-01-20 NOTE — Progress Notes (Signed)
[place] @ [facility] [phone]  Fax:([phone]) [place]: 03/07/1951  MR#: [id number]  WLS#:[id number]  Patient Care Team: Maryland Pink, MD as PCP - General (Family Medicine) Seeplaputhur [name], MD (General Surgery) [name], MD as Consulting Physician (Obstetrics and Gynecology) [name], MD as Consulting Physician (Surgical Oncology)  CHIEF COMPLAINT:  Chief Complaint  Patient presents with  . 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 2.Patient has BRCA mutation positive   3. Left breast mastectomy (April, 2015) T2, N0, M0 tumor. Estrogen receptor negative. Progesterone receptor weakly positive. HER-2/neu receptor not amplified  MAMAPRINT ;high risk with 5years chance of    recurrence 22-28%and 10 years chance to 22-35% status post bilateral mastectomy 4. Oophorectomy was attempted but they could not locate the ovaries.[name]  5.Patient started Taxotere and carboplatinum adjuvant chemotherapy from [place] 21st, 2015 6.  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 ERneg, PR weakly pos, Her2 neg.   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 ERneg, PR weakly pos, Her2 neg.    Breast cancer, female   10/14/2013 Initial Diagnosis Breast cancer, female    Oncology Flowsheet 12/20/2014  dexamethasone (DECADRON) IJ    ondansetron (ZOFRAN) IV 4 mg    INTERVAL HISTORY: 64 year old lady who had a previous history of bilateral mastectomy because of bilateral Breast cancer.  BRCA mutation.  Patient underwent prophylactic oophorectomy.  Gradually improving performance status patient is back to work.  No nausea.  No vomiting.  No diarrhea.  Here for further follow-up  and treatment consideration REVIEW OF SYSTEMS:   GENERAL:  Feels good.  Active.  No fevers, sweats or weight loss. PERFORMANCE STATUS (ECOG):01 HEENT:  No visual changes, runny nose, sore throat, mouth sores or tenderness. Lungs: No shortness of breath or cough.  No hemoptysis. Cardiac:  No chest pain, palpitations, orthopnea, or PND. GI:  No nausea, vomiting, diarrhea, constipation, melena or hematochezia. GU:  No urgency, frequency, dysuria, or hematuria. Musculoskeletal:  No back pain.  No joint pain.  No muscle tenderness. Extremities:  No pain or swelling. Skin:  No rashes or skin changes. Neuro:  No headache, numbness or weakness, balance or coordination issues. Endocrine:  No diabetes, thyroid issues, hot flashes or night sweats. Psych:  No mood changes, depression or anxiety. Pain:  No focal pain. Review of systems:  All other systems reviewed and found to be negative. As per HPI. Otherwise, a complete review of systems is negatve.  PAST MEDICAL HISTORY: Past Medical History  Diagnosis Date  . Endocrine problem 2009    thyroid  . Personal history of malignant neoplasm of breast 2009    right breast  . Breast screening, unspecified   . Special screening for malignant neoplasms, colon   . Obesity, unspecified   . Bladder infection   . Acid reflux 2006  . Cancer 1997    cervical  . Hypothyroidism   . Anemia     history    PAST SURGICAL HISTORY: Past Surgical History  Procedure Laterality Date  . Portacath placement  2009,11/15/13  . Cesarean section  1983  . Abdominal hysterectomy  1997  . Port-a-cath removal  2012  . Colonoscopy  2011    Dr.  Elliott  . Upper gi endoscopy  2011    Dr. Vira Agar  . Appendectomy    . Breast surgery Bilateral 10/06/13    mastectomy  . Laparoscopic bilateral salpingo oopherectomy Bilateral 12/20/2014    Procedure: LAPAROSCOPIC BILATERAL SALPINGO OOPHORECTOMY;  Surgeon: [name], MD;  Location: [facility] ORS;  Service: Gynecology;   Laterality: Bilateral;  . Laparotomy N/A 12/20/2014    Procedure: LAPAROTOMY;  Surgeon: [name], MD;  Location: [facility] ORS;  Service: Gynecology;  Laterality: N/A;    FAMILY HISTORY Family History  Problem Relation Age of Onset  . Cancer Paternal Grandfather     cancer of the heel    ADVANCED DIRECTIVES:  Patient does have advance healthcare directive, Patient   does not desire to make any changes HEALTH MAINTENANCE: History  Substance Use Topics  . Smoking status: Never Smoker   . Smokeless tobacco: Never Used  . Alcohol Use: No      Allergies  Allergen Reactions  . Omeprazole   . Tape     Redness   . Sulfa Antibiotics Rash    Current Outpatient Prescriptions  Medication Sig Dispense Refill  . atorvastatin (LIPITOR) 10 MG tablet Take 10 mg by mouth daily.    [name] azithromycin (ZITHROMAX Z-PAK) 250 MG tablet Take as directed 6 each 0  . Cholecalciferol (VITAMIN D3) 1000 UNITS CAPS Take by mouth daily.    [name] esomeprazole (NEXIUM) 40 MG capsule Take 40 mg by mouth as needed.     [name] ibuprofen (ADVIL,MOTRIN) 600 MG tablet Take 1 tablet (600 mg total) by mouth every 6 (six) hours as needed for mild pain. 30 tablet 0  . levothyroxine (SYNTHROID, LEVOTHROID) 50 MCG tablet Take 50 mcg by mouth daily.    . Multiple Vitamin (MULTIVITAMIN) capsule Take 1 capsule by mouth daily.    [name] oxyCODONE-acetaminophen (PERCOCET) 5-325 MG per tablet Take 2 tablets by mouth every 4 (four) hours as needed for severe pain. 30 tablet 0  . vitamin C (ASCORBIC ACID) 500 MG tablet Take 500 mg by mouth daily.     No current facility-administered medications for this visit.   Facility-Administered Medications Ordered in Other Visits  Medication Dose Route Frequency Provider Last Rate Last Dose  . sodium chloride 0.9 % injection 10 mL  10 mL Intravenous PRN Evlyn Kanner, NP   10 mL at 01/16/15 1527    OBJECTIVE:  Filed Vitals:   01/16/15 1445  BP: 113/67  Pulse: 80  Temp: 96.7 F (35.9 C)      Body mass index is 28.5 kg/(m^2).    ECOG FS:1 - Symptomatic but completely ambulatory  PHYSICAL EXAM: GENERAL:  Well developed, well nourished, sitting comfortably in the exam room in no acute distress. MENTAL STATUS:  Alert and oriented to person, place and time. . ENT:  Oropharynx clear without lesion.  Tongue normal. Mucous membranes moist.  RESPIRATORY:  Clear to auscultation without rales, wheezes or rhonchi. CARDIOVASCULAR:  Regular rate and rhythm without murmur, rub or gallop. BREAST:  Status post bilateral mastectomy.  No evidence of recurrent disease. ABDOMEN:  Soft, non-tender, with active bowel sounds, and no hepatosplenomegaly.  No masses. BACK:  No CVA tenderness.  No tenderness on percussion of the back or rib cage. SKIN:  No rashes, ulcers or lesions. EXTREMITIES: No edema, no skin discoloration or tenderness.  No palpable cords. LYMPH NODES: No palpable cervical, supraclavicular, axillary or inguinal adenopathy  NEUROLOGICAL: Unremarkable. PSYCH:  Appropriate. Patient had bilateral oophorectomy   LAB  RESULTS:  No visits with results within 2 Day(s) from this visit. Latest known visit with results is:  Admission on 12/20/2014, Discharged on 12/20/2014  Component Date Value Ref Range Status  . SURGICAL PATHOLOGY 12/20/2014    Final                   Value:Surgical Pathology CASE: [id number] PATIENT: [facility] Surgical Pathology Report     SPECIMEN SUBMITTED: A. Ovary and tube, right B. Ovary and tube, left  CLINICAL HISTORY: None provided  PRE-OPERATIVE DIAGNOSIS: Brca history of breast cancer  POST-OPERATIVE DIAGNOSIS: Brca history of breast cancer     DIAGNOSIS: A. RIGHT OVARY AND FALLOPIAN TUBE; PROPHYLACTIC RIGHT SALPINGO-OOPHORECTOMY: - SIMPLE CYST OF THE OVARY (5 MM). - FALLOPIAN TUBE WITH PARATUBAL CYST. - NEGATIVE FOR ATYPIA AND MALIGNANCY.  B. LEFT OVARY AND FALLOPIAN TUBE; PROPHYLACTIC LEFT SALPINGO-OOPHORECTOMY: -  OVARY WITHIN NORMAL LIMITS. - FALLOPIAN TUBE WITH PARATUBAL CYST. - NEGATIVE FOR ATYPIA AND MALIGNANCY.   GROSS DESCRIPTION: A.Labeled: Right tube and ovary  Type of specimen: Ovary with segment of fallopian tube with fimbria and attached adipose tissue, with 4 metallic staples embedded into the tissue  Integrity: Ragged  Size and weight overall: 13 g, 4.6 x 4.6                          x 1.5 cm overall.   Ovarian surface: Pale-tan and soft, cut surface is vaguely nodular  Cyst Contents: N/A  Fallopian Tube: Yes, unremarkable  Block Summary: Entirely submitted 1-5-ovary with attached adipose tissue 6-7-entire longitudinally sectioned fimbriated end of fallopian tube and representative cross-sections  B. Labeled: Left tube and ovary  Type of specimen: Salpingo-oophorectomy  Integrity: Ragged  Size and weight overall: 5 g, 4.2 x 2.8 x 1 cm overall   Ovarian surface: Irregular and ragged, cut surface is dark red and soft  Cyst Contents: N/A  Fallopian Tube: Present without fimbria  Block Summary: 1-3 entire ovary 4-6 entire fallopian tube     Final Diagnosis performed by Delorse Lek, MD.  Electronically signed 12/25/2014 2:31:31PM    The electronic signature indicates that the named Attending Pathologist has evaluated the specimen  Technical component performed at [facility], [address], [place], [place] [address] Lab: [place]: Darrick Penna. Evette Doffing, MD  Professional component performed at [facility], [facility], [place], [place], [place] [address] Lab: [phone] Dir: Dellia Nims. Rubinas, MD        Pathology  report of bilateral oophorectomy shows no evidence of recurrent disease.  Lab data (January 05, 2015) from [facility] WBC 5.2 Hemoglobin 11.5 Platelet: 258, 000 BUN 14 Serum creatinine 0.91 liver enzymes are within normal limits ASSESSMENT: Bilateral carcinoma of breast and the patient was  BRCA1 mutation No evidence of recurrent disease at present time Status post bilateral prophylactic oophorectomy Patient is recovering from surgical intervention  MEDICAL DECISION MAKING:  Lab data has been reviewed Data from the outside institution has been reviewed. Patient expressed understanding and was in agreement with this plan. She also understands that She can call clinic at any time with any questions, concerns, or complaints.   On evidence of recurrent disease  No matching staging information was found for the patient.  Forest Gleason, MD   01/20/2015 8:06 AM

## 2015-03-06 ENCOUNTER — Inpatient Hospital Stay: Payer: 59 | Attending: Oncology

## 2015-03-06 DIAGNOSIS — C801 Malignant (primary) neoplasm, unspecified: Secondary | ICD-10-CM

## 2015-03-06 DIAGNOSIS — Z853 Personal history of malignant neoplasm of breast: Secondary | ICD-10-CM | POA: Insufficient documentation

## 2015-03-06 DIAGNOSIS — Z452 Encounter for adjustment and management of vascular access device: Secondary | ICD-10-CM | POA: Insufficient documentation

## 2015-03-06 MED ORDER — HEPARIN SOD (PORK) LOCK FLUSH 100 UNIT/ML IV SOLN
500.0000 [IU] | Freq: Once | INTRAVENOUS | Status: AC
  Administered 2015-03-06: 500 [IU] via INTRAVENOUS
  Filled 2015-03-06: qty 5

## 2015-03-06 MED ORDER — SODIUM CHLORIDE 0.9 % IJ SOLN
10.0000 mL | Freq: Once | INTRAMUSCULAR | Status: AC
Start: 2015-03-06 — End: 2015-03-06
  Administered 2015-03-06: 10 mL via INTRAVENOUS
  Filled 2015-03-06: qty 10

## 2015-04-09 ENCOUNTER — Telehealth: Payer: Self-pay | Admitting: *Deleted

## 2015-04-09 IMAGING — MG MM CAD DIAGNOSTIC MAMMO
1 series · 6 of 6 positions shown · non-contrast
Comparison: none

REASON FOR EXAM: HX BRST CA
COMMENTS:

PROCEDURE:     MAM - MAM DGTL DIAGNOSTIC MAMMO W/CAD  - October 13, 2012  [DATE]
RESULT:     Bilateral digital screening mammogram with CAD dated 10/13/2012
Comparison study/studies dated: 10/13/2011, 10/08/2010, 04/01/2010, 08/28/2008

[R CC · right · 6 of 6 slices shown]
[im 1/6]
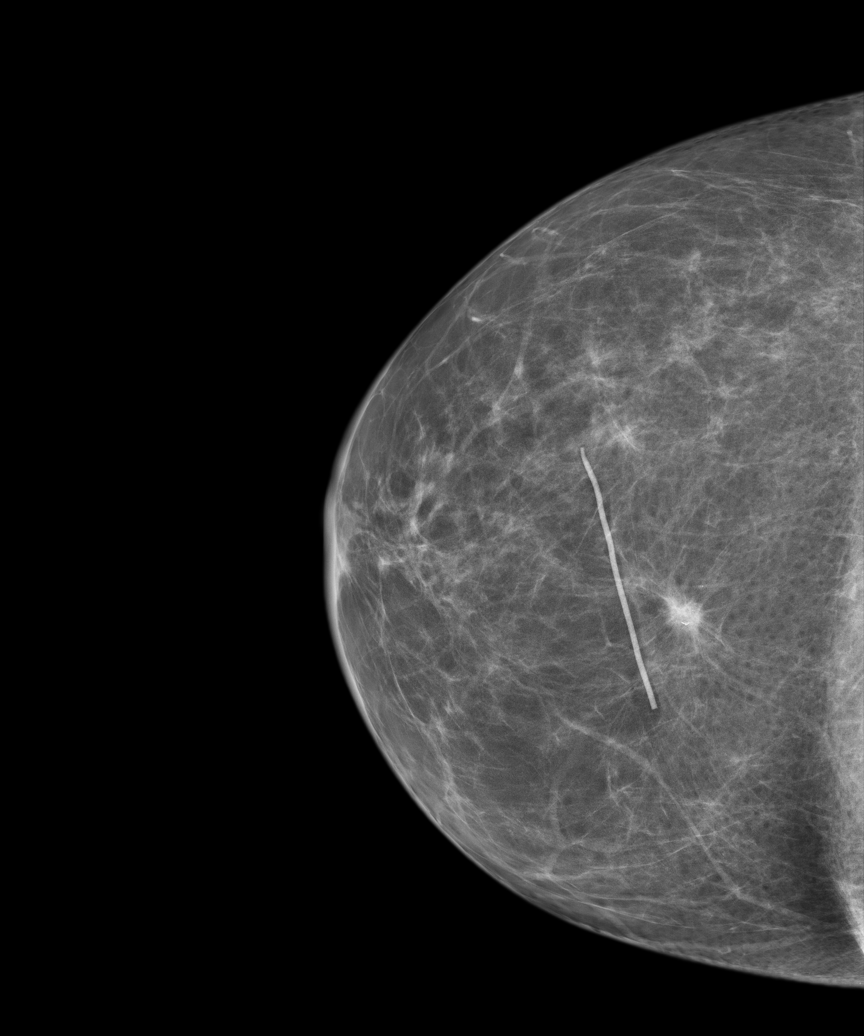
[im 2/6]
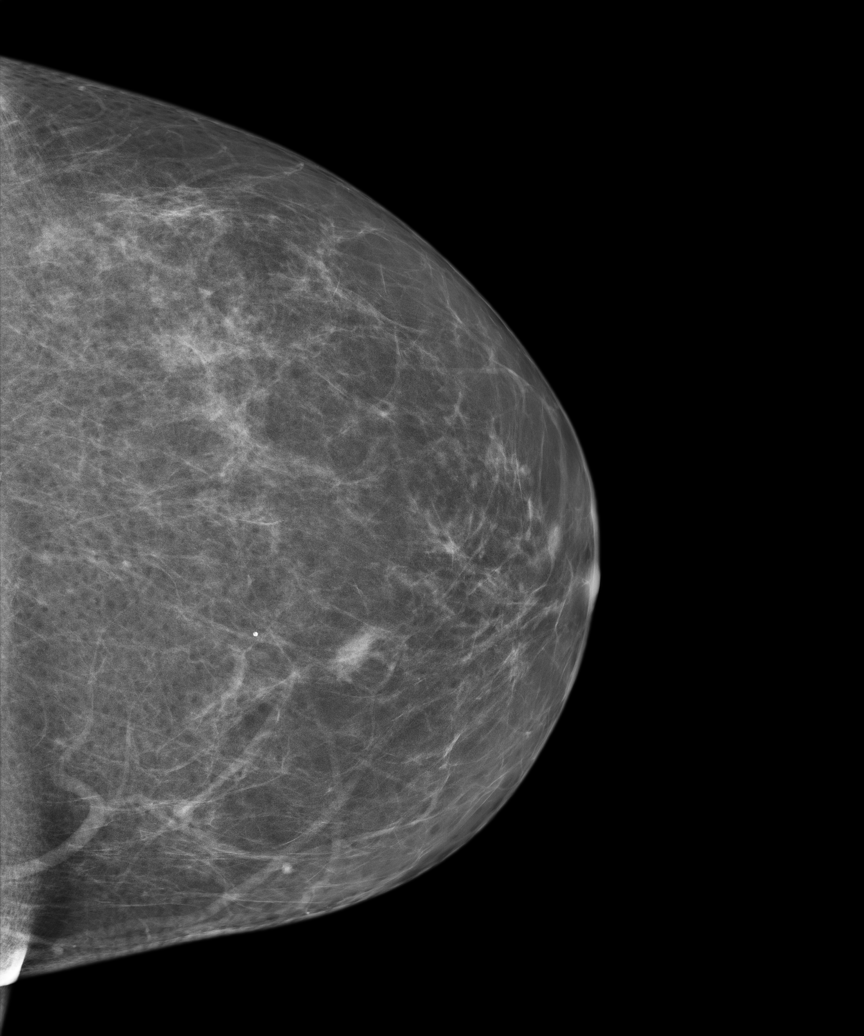
[im 3/6]
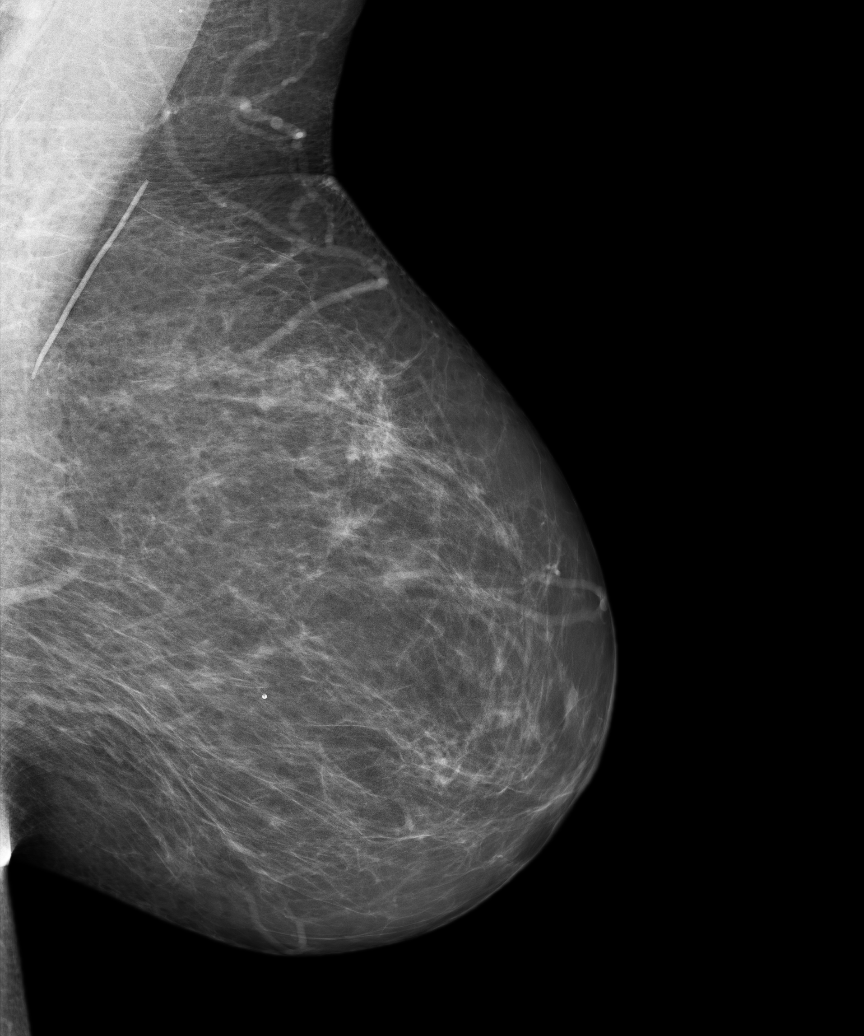
[im 4/6]
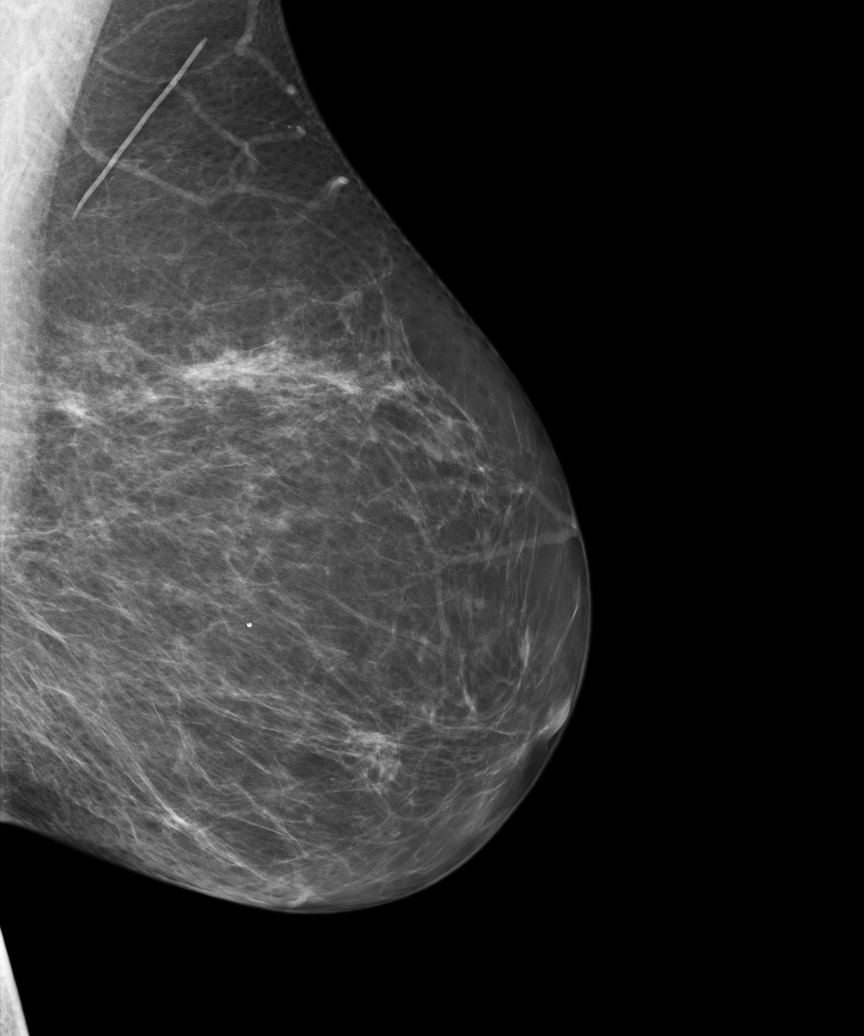
[im 5/6]
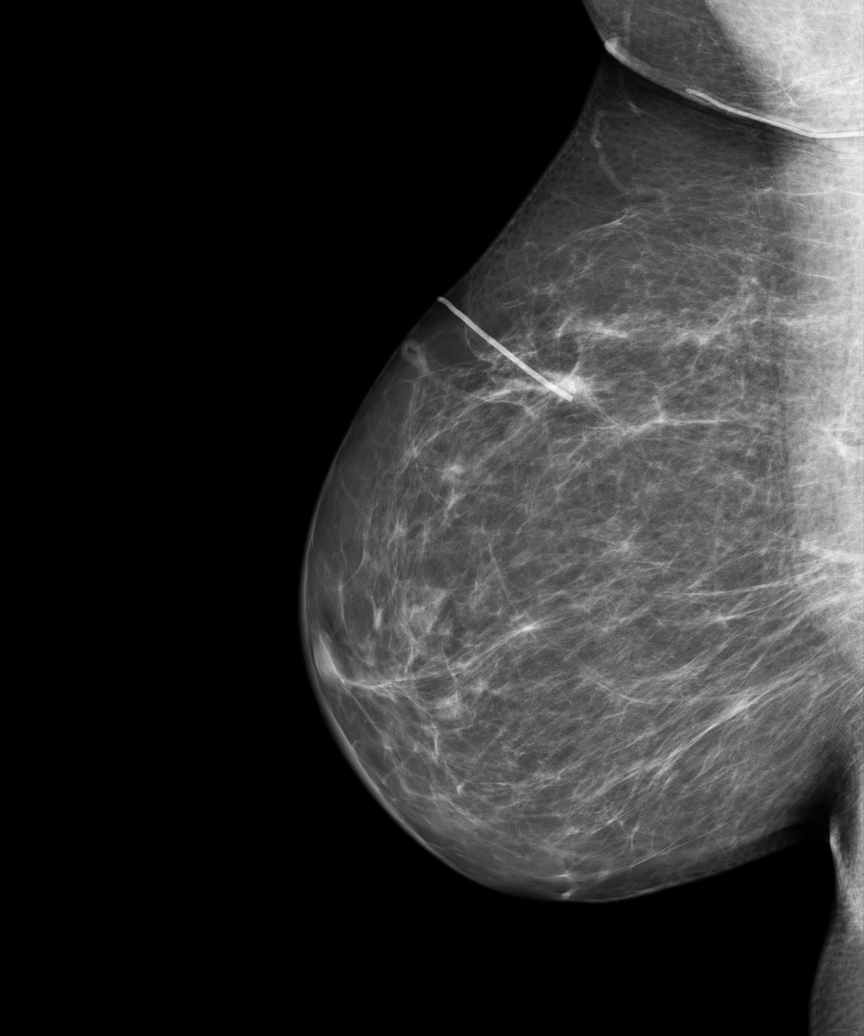
[im 6/6]
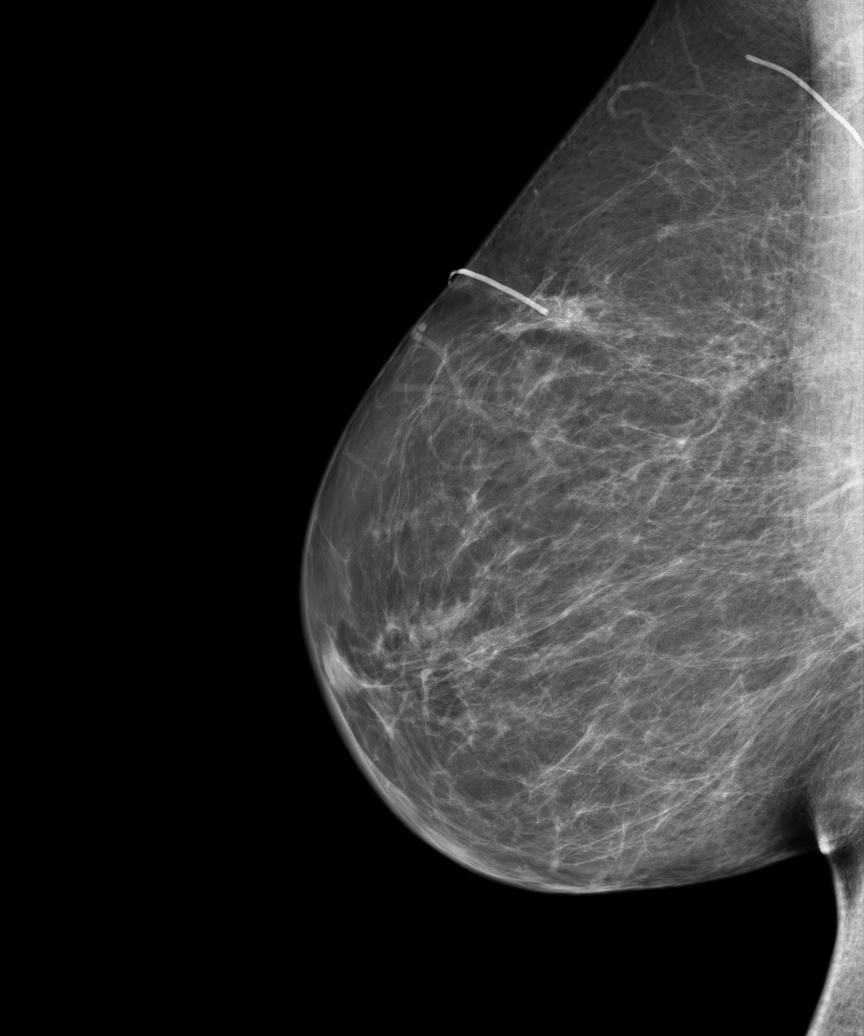

[6 of 6 positions shown; findings below may reference images not displayed]

FINDINGS: BREAST COMPOSITION: The breast composition is SCATTERED FIBROGLANDULAR
TISSUE (glandular tissue is 25-50%)

An area of increased density projects in the superior medial portion of the
right breast which when compared to previous study appears slightly smaller
and increased density having the appearance of an involuting radial scar.
There is otherwise no radiographic evidence to suggest malignancy.
IMPRESSION: BI-RADS: Category 2- Benign Finding

A NEGATIVE MAMMOGRAM REPORT DOES NOT PRECLUDE BIOPSY OR OTHER EVALUATION OF
A CLINICALLY PALPABLE OR OTHERWISE SUSPICIOUS MASS OR LESION. BREAST CANCER
MAY NOT BE DETECTED IN UP TO 10% OF CASES.

## 2015-04-09 NOTE — Telephone Encounter (Signed)
Patient was told by Dr.Choksi that she can have her port removed the 1st of the year. Patient wanted to talk to you and see if you could talk to Dr.Choksi about seeing if she can get it removed at the end of the year due to meeting her deductible already.

## 2015-04-24 ENCOUNTER — Inpatient Hospital Stay: Payer: 59 | Attending: Oncology

## 2015-04-24 DIAGNOSIS — Z452 Encounter for adjustment and management of vascular access device: Secondary | ICD-10-CM | POA: Diagnosis not present

## 2015-04-24 DIAGNOSIS — Z853 Personal history of malignant neoplasm of breast: Secondary | ICD-10-CM | POA: Insufficient documentation

## 2015-04-24 DIAGNOSIS — C801 Malignant (primary) neoplasm, unspecified: Secondary | ICD-10-CM

## 2015-04-24 MED ORDER — SODIUM CHLORIDE 0.9 % IJ SOLN
10.0000 mL | INTRAMUSCULAR | Status: DC | PRN
  Administered 2015-04-24: 10 mL via INTRAVENOUS
  Filled 2015-04-24: qty 10

## 2015-04-24 MED ORDER — HEPARIN SOD (PORK) LOCK FLUSH 100 UNIT/ML IV SOLN
500.0000 [IU] | Freq: Once | INTRAVENOUS | Status: AC
  Administered 2015-04-24: 500 [IU] via INTRAVENOUS

## 2015-04-24 MED ORDER — HEPARIN SOD (PORK) LOCK FLUSH 100 UNIT/ML IV SOLN
INTRAVENOUS | Status: AC
  Filled 2015-04-24: qty 5

## 2015-05-22 ENCOUNTER — Encounter: Payer: Self-pay | Admitting: General Surgery

## 2015-05-22 ENCOUNTER — Ambulatory Visit (INDEPENDENT_AMBULATORY_CARE_PROVIDER_SITE_OTHER): Payer: 59 | Admitting: General Surgery

## 2015-05-22 VITALS — BP 122/70 | HR 76 | Resp 12 | Ht 65.0 in | Wt 184.0 lb

## 2015-05-22 DIAGNOSIS — Z853 Personal history of malignant neoplasm of breast: Secondary | ICD-10-CM | POA: Diagnosis not present

## 2015-05-22 NOTE — Patient Instructions (Signed)
Discuss port removal with Dr. Jeb Levering.  Patient will be asked to return to the office in 6 mos breast cancer check.

## 2015-05-22 NOTE — Progress Notes (Signed)
Patient ID: Morgan Flores, female   DOB: [birth year], 64 y.o.   MRN: [id number]  Chief Complaint  Patient presents with  . Follow-up    breast cancer check    HPI Morgan Flores is a 64 y.o. female here today for her six months follow up breast cancer check. Patient states she is doing well. After her last visit here in May she underwent lap/bilateral oophorectomy.  Overall she is doing well.  I have reviewed the history of present illness with the patient. HPI  Past Medical History  Diagnosis Date  . Endocrine problem 2009    thyroid  . Personal history of malignant neoplasm of breast 2009    right breast  . Breast screening, unspecified   . Special screening for malignant neoplasms, colon   . Obesity, unspecified   . Bladder infection   . Acid reflux 2006  . Cancer ([place]) 1997    cervical  . Hypothyroidism   . Anemia     history    Past Surgical History  Procedure Laterality Date  . Portacath placement  2009,11/15/13  . Cesarean section  1983  . Abdominal hysterectomy  1997  . Port-a-cath removal  2012  . Colonoscopy  2011    Dr. Vira Agar  . Upper gi endoscopy  2011    Dr. Vira Agar  . Appendectomy    . Breast surgery Bilateral 10/06/13    mastectomy  . Laparoscopic bilateral salpingo oopherectomy Bilateral 12/20/2014    Procedure: LAPAROSCOPIC BILATERAL SALPINGO OOPHORECTOMY;  Surgeon: [name], MD;  Location: [facility] ORS;  Service: Gynecology;  Laterality: Bilateral;  . Laparotomy N/A 12/20/2014    Procedure: LAPAROTOMY;  Surgeon: [name], MD;  Location: [facility] ORS;  Service: Gynecology;  Laterality: N/A;    Family History  Problem Relation Age of Onset  . Cancer Paternal Grandfather     cancer of the heel    Social History Social History  Substance Use Topics  . Smoking status: Never Smoker   . Smokeless tobacco: Never Used  . Alcohol Use: No    Allergies  Allergen Reactions  . Omeprazole   . Tape     Redness   . Sulfa Antibiotics Rash     Current Outpatient Prescriptions  Medication Sig Dispense Refill  . atorvastatin (LIPITOR) 10 MG tablet Take 10 mg by mouth daily.    . Cholecalciferol (VITAMIN D3) 1000 UNITS CAPS Take by mouth daily.    [name] esomeprazole (NEXIUM) 40 MG capsule Take 40 mg by mouth as needed.     [name] ibuprofen (ADVIL,MOTRIN) 600 MG tablet Take 1 tablet (600 mg total) by mouth every 6 (six) hours as needed for mild pain. 30 tablet 0  . levothyroxine (SYNTHROID, LEVOTHROID) 50 MCG tablet Take 50 mcg by mouth daily.    . Multiple Vitamin (MULTIVITAMIN) capsule Take 1 capsule by mouth daily.    . vitamin C (ASCORBIC ACID) 500 MG tablet Take 500 mg by mouth daily.     No current facility-administered medications for this visit.   Facility-Administered Medications Ordered in Other Visits  Medication Dose Route Frequency Provider Last Rate Last Dose  . sodium chloride 0.9 % injection 10 mL  10 mL Intravenous PRN Evlyn Kanner, NP   10 mL at 01/16/15 1527    Review of Systems Review of Systems  Constitutional: Negative.   Respiratory: Negative.   Cardiovascular: Negative.     Blood pressure 122/70, pulse 76, resp. rate 12, height '5\' 5"'  (1.651 m), weight  184 lb (83.462 kg).  Physical Exam Physical Exam  Constitutional: She is oriented to person, place, and time. She appears well-developed and well-nourished.  Eyes: Conjunctivae are normal. No scleral icterus.  Neck: Neck supple.  Cardiovascular: Normal rate, regular rhythm and normal heart sounds.   Pulmonary/Chest: Effort normal and breath sounds normal.  Bilateral mastectomy site well healed. No signs of cancer recurrence.  Abdominal: Soft. Bowel sounds are normal. There is no hepatomegaly. There is no tenderness.  Lymphadenopathy:    She has no cervical adenopathy.    She has no axillary adenopathy.  Neurological: She is alert and oriented to person, place, and time.  Skin: Skin is warm and dry.    Data Reviewed Notes reviewed  Assessment     CA breast, BRCA 1. S/P bilateral mastectomy. Stable exam    Plan   Discuss port removal with Dr. Jeb Levering. Patient will be asked to return to the office in 6 mos breast cancer check.      PCP:  Buzzy Han 05/22/2015, 9:37 AM

## 2015-05-30 ENCOUNTER — Telehealth: Payer: Self-pay | Admitting: *Deleted

## 2015-05-30 NOTE — Telephone Encounter (Signed)
[facility] form placed in the mail addressed to patient

## 2015-05-30 NOTE — Telephone Encounter (Signed)
States she scheduled an appt with Dr Oliva Bustard for 12/13 and that Dr Jamal Collin spoke with Dr Jeb Levering regarding removing her port. Asking we fax an order to [facility] for labs to be drawn and that we call her to discuss

## 2015-05-31 ENCOUNTER — Encounter: Payer: Self-pay | Admitting: Oncology

## 2015-06-06 ENCOUNTER — Encounter: Payer: Self-pay | Admitting: Oncology

## 2015-06-12 ENCOUNTER — Inpatient Hospital Stay: Payer: 59

## 2015-06-12 ENCOUNTER — Encounter: Payer: Self-pay | Admitting: Oncology

## 2015-06-12 ENCOUNTER — Inpatient Hospital Stay: Payer: 59 | Attending: Oncology | Admitting: Oncology

## 2015-06-12 VITALS — BP 111/74 | HR 87 | Temp 97.3°F | Resp 18 | Wt 185.4 lb

## 2015-06-12 DIAGNOSIS — Z9221 Personal history of antineoplastic chemotherapy: Secondary | ICD-10-CM | POA: Diagnosis not present

## 2015-06-12 DIAGNOSIS — K219 Gastro-esophageal reflux disease without esophagitis: Secondary | ICD-10-CM | POA: Diagnosis not present

## 2015-06-12 DIAGNOSIS — Z1509 Genetic susceptibility to other malignant neoplasm: Secondary | ICD-10-CM

## 2015-06-12 DIAGNOSIS — Z90722 Acquired absence of ovaries, bilateral: Secondary | ICD-10-CM

## 2015-06-12 DIAGNOSIS — Z808 Family history of malignant neoplasm of other organs or systems: Secondary | ICD-10-CM | POA: Insufficient documentation

## 2015-06-12 DIAGNOSIS — Z79899 Other long term (current) drug therapy: Secondary | ICD-10-CM

## 2015-06-12 DIAGNOSIS — Z853 Personal history of malignant neoplasm of breast: Secondary | ICD-10-CM

## 2015-06-12 DIAGNOSIS — C50912 Malignant neoplasm of unspecified site of left female breast: Secondary | ICD-10-CM

## 2015-06-12 DIAGNOSIS — Z9013 Acquired absence of bilateral breasts and nipples: Secondary | ICD-10-CM

## 2015-06-12 DIAGNOSIS — C50919 Malignant neoplasm of unspecified site of unspecified female breast: Secondary | ICD-10-CM

## 2015-06-12 DIAGNOSIS — C50911 Malignant neoplasm of unspecified site of right female breast: Secondary | ICD-10-CM

## 2015-06-12 DIAGNOSIS — E669 Obesity, unspecified: Secondary | ICD-10-CM | POA: Insufficient documentation

## 2015-06-12 DIAGNOSIS — E039 Hypothyroidism, unspecified: Secondary | ICD-10-CM | POA: Insufficient documentation

## 2015-06-12 DIAGNOSIS — Z1501 Genetic susceptibility to malignant neoplasm of breast: Secondary | ICD-10-CM

## 2015-06-12 NOTE — Progress Notes (Signed)
Patient would like to discuss getting her port taken out.

## 2015-06-12 NOTE — Progress Notes (Signed)
[place] @ [facility] [phone]  Fax:([phone]) [place]: [birth year]  MR#: [id number]  AST#:[id number]  Patient Care Team: [place] Pink, MD as PCP - General (Family Medicine) Seeplaputhur [name], MD (General Surgery) [name], MD as Consulting Physician (Obstetrics and Gynecology) [name], MD as Consulting Physician (Surgical Oncology)  CHIEF COMPLAINT:  Chief Complaint  Patient presents with  . Breast Cancer    Oncology History   Carcinoma of breast, status post lumpectomy diagnoses in March of 2009 (right breast.) triple negative  Stage II a . 2.Patient has BRCA mutation positive   3. Left breast mastectomy (April, 2015) T2, N0, M0 tumor. Estrogen receptor negative. Progesterone receptor weakly positive. HER-2/neu receptor not amplified  MAMAPRINT ;high risk with 5years chance of    recurrence 22-28%and 10 years chance to 22-35% status post bilateral mastectomy 4. Oophorectomy was attempted but they could not locate the ovaries.[name]  5.Patient started Taxotere and carboplatinum adjuvant chemotherapy from [place] 21st, 2015 6.  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 ERneg, PR weakly pos, Her2 neg.   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 ERneg, PR weakly pos, Her2 neg.    Breast cancer, female ([place])   10/14/2013 Initial Diagnosis Breast cancer, female    Oncology Flowsheet 12/20/2014  dexamethasone (DECADRON) IJ    ondansetron (ZOFRAN) IV 4 mg    INTERVAL HISTORY: 64 year old lady who had a previous history of bilateral mastectomy because of bilateral Breast cancer.  BRCA mutation.  Patient underwent prophylactic oophorectomy.  Gradually improving performance status patient is back to work.  No nausea.  No vomiting.  No diarrhea.  Here for further  follow-up and treatment consideration  Patient is here for further follow-up regarding bilateral carcinoma breast. Patient has BRCA mutation for which patient undergone bilateral nephrectomy. Patient also desires to get port removed.  No nausea no vomiting no diarrhea appetite has been stable. REVIEW OF SYSTEMS:   GENERAL:  Feels good.  Active.  No fevers, sweats or weight loss. PERFORMANCE STATUS (ECOG):01 HEENT:  No visual changes, runny nose, sore throat, mouth sores or tenderness. Lungs: No shortness of breath or cough.  No hemoptysis. Cardiac:  No chest pain, palpitations, orthopnea, or PND. GI:  No nausea, vomiting, diarrhea, constipation, melena or hematochezia. GU:  No urgency, frequency, dysuria, or hematuria. Musculoskeletal:  No back pain.  No joint pain.  No muscle tenderness. Extremities:  No pain or swelling. Skin:  No rashes or skin changes. Neuro:  No headache, numbness or weakness, balance or coordination issues. Endocrine:  No diabetes, thyroid issues, hot flashes or night sweats. Psych:  No mood changes, depression or anxiety. Pain:  No focal pain. Review of systems:  All other systems reviewed and found to be negative. As per HPI. Otherwise, a complete review of systems is negatve.  PAST MEDICAL HISTORY: Past Medical History  Diagnosis Date  . Endocrine problem 2009    thyroid  . Personal history of malignant neoplasm of breast 2009    right breast  . Breast screening, unspecified   . Special screening for malignant neoplasms, colon   . Obesity, unspecified   . Bladder infection   . Acid reflux 2006  . Cancer ([place]) 1997    cervical  . Hypothyroidism   . Anemia     history    PAST  SURGICAL HISTORY: Past Surgical History  Procedure Laterality Date  . Portacath placement  2009,11/15/13  . Cesarean section  1983  . Abdominal hysterectomy  1997  . Port-a-cath removal  2012  . Colonoscopy  2011    Dr. Vira Agar  . Upper gi endoscopy  2011    Dr. Vira Agar    . Appendectomy    . Breast surgery Bilateral 10/06/13    mastectomy  . Laparoscopic bilateral salpingo oopherectomy Bilateral 12/20/2014    Procedure: LAPAROSCOPIC BILATERAL SALPINGO OOPHORECTOMY;  Surgeon: [name], MD;  Location: [facility] ORS;  Service: Gynecology;  Laterality: Bilateral;  . Laparotomy N/A 12/20/2014    Procedure: LAPAROTOMY;  Surgeon: [name], MD;  Location: [facility] ORS;  Service: Gynecology;  Laterality: N/A;    FAMILY HISTORY Family History  Problem Relation Age of Onset  . Cancer Paternal Grandfather     cancer of the heel    ADVANCED DIRECTIVES:  Patient does have advance healthcare directive, Patient   does not desire to make any changes HEALTH MAINTENANCE: Social History  Substance Use Topics  . Smoking status: Never Smoker   . Smokeless tobacco: Never Used  . Alcohol Use: No      Allergies  Allergen Reactions  . Omeprazole   . Tape     Redness   . Sulfa Antibiotics Rash    Current Outpatient Prescriptions  Medication Sig Dispense Refill  . atorvastatin (LIPITOR) 10 MG tablet Take 10 mg by mouth daily.    . Cholecalciferol (VITAMIN D3) 1000 UNITS CAPS Take by mouth daily.    [name] esomeprazole (NEXIUM) 40 MG capsule Take 40 mg by mouth as needed.     [name] ibuprofen (ADVIL,MOTRIN) 600 MG tablet Take 1 tablet (600 mg total) by mouth every 6 (six) hours as needed for mild pain. 30 tablet 0  . levothyroxine (SYNTHROID, LEVOTHROID) 50 MCG tablet Take 50 mcg by mouth daily.    . Multiple Vitamin (MULTIVITAMIN) capsule Take 1 capsule by mouth daily.    . vitamin C (ASCORBIC ACID) 500 MG tablet Take 500 mg by mouth daily.     No current facility-administered medications for this visit.   Facility-Administered Medications Ordered in Other Visits  Medication Dose Route Frequency Provider Last Rate Last Dose  . sodium chloride 0.9 % injection 10 mL  10 mL Intravenous PRN Evlyn Kanner, NP   10 mL at 01/16/15 1527    OBJECTIVE:  Filed Vitals:    06/12/15 1503  BP: 111/74  Pulse: 87  Temp: 97.3 F (36.3 C)  Resp: 18     Body mass index is 30.85 kg/(m^2).    ECOG FS:1 - Symptomatic but completely ambulatory  PHYSICAL EXAM: GENERAL:  Well developed, well nourished, sitting comfortably in the exam room in no acute distress. MENTAL STATUS:  Alert and oriented to person, place and time. . ENT:  Oropharynx clear without lesion.  Tongue normal. Mucous membranes moist.  RESPIRATORY:  Clear to auscultation without rales, wheezes or rhonchi. CARDIOVASCULAR:  Regular rate and rhythm without murmur, rub or gallop. BREAST:  Status post bilateral mastectomy.  No evidence of recurrent disease. ABDOMEN:  Soft, non-tender, with active bowel sounds, and no hepatosplenomegaly.  No masses. BACK:  No CVA tenderness.  No tenderness on percussion of the back or rib cage. SKIN:  No rashes, ulcers or lesions. EXTREMITIES: No edema, no skin discoloration or tenderness.  No palpable cords. LYMPH NODES: No palpable cervical, supraclavicular, axillary or inguinal adenopathy  NEUROLOGICAL: Unremarkable. PSYCH:  Appropriate. Patient had bilateral oophorectomy   LAB RESULTS:  No visits with results within 2 Day(s) from this visit. Latest known visit with results is:  Admission on 12/20/2014, Discharged on 12/20/2014  Component Date Value Ref Range Status  . SURGICAL PATHOLOGY 12/20/2014    Final                   Value:Surgical Pathology CASE: [id number] PATIENT: [facility] Surgical Pathology Report     SPECIMEN SUBMITTED: A. Ovary and tube, right B. Ovary and tube, left  CLINICAL HISTORY: None provided  PRE-OPERATIVE DIAGNOSIS: Brca history of breast cancer  POST-OPERATIVE DIAGNOSIS: Brca history of breast cancer     DIAGNOSIS: A. RIGHT OVARY AND FALLOPIAN TUBE; PROPHYLACTIC RIGHT SALPINGO-OOPHORECTOMY: - SIMPLE CYST OF THE OVARY (5 MM). - FALLOPIAN TUBE WITH PARATUBAL CYST. - NEGATIVE FOR ATYPIA AND MALIGNANCY.  B.  LEFT OVARY AND FALLOPIAN TUBE; PROPHYLACTIC LEFT SALPINGO-OOPHORECTOMY: - OVARY WITHIN NORMAL LIMITS. - FALLOPIAN TUBE WITH PARATUBAL CYST. - NEGATIVE FOR ATYPIA AND MALIGNANCY.   GROSS DESCRIPTION: A.Labeled: Right tube and ovary  Type of specimen: Ovary with segment of fallopian tube with fimbria and attached adipose tissue, with 4 metallic staples embedded into the tissue  Integrity: Ragged  Size and weight overall: 13 g, 4.6 x 4.6                          x 1.5 cm overall.   Ovarian surface: Pale-tan and soft, cut surface is vaguely nodular  Cyst Contents: N/A  Fallopian Tube: Yes, unremarkable  Block Summary: Entirely submitted 1-5-ovary with attached adipose tissue 6-7-entire longitudinally sectioned fimbriated end of fallopian tube and representative cross-sections  B. Labeled: Left tube and ovary  Type of specimen: Salpingo-oophorectomy  Integrity: Ragged  Size and weight overall: 5 g, 4.2 x 2.8 x 1 cm overall   Ovarian surface: Irregular and ragged, cut surface is dark red and soft  Cyst Contents: N/A  Fallopian Tube: Present without fimbria  Block Summary: 1-3 entire ovary 4-6 entire fallopian tube     Final Diagnosis performed by Delorse Lek, MD.  Electronically signed 12/25/2014 2:31:31PM    The electronic signature indicates that the named Attending Pathologist has evaluated the specimen  Technical component performed at [facility], [address], [place], [place] [address] Lab: [place]: Darrick Penna. Evette Doffing, MD  Professional component performed at [facility], [facility], [place], [place],  [address] Lab: [phone] Dir: Dellia Nims. Rubinas, MD        Pathology  report of bilateral oophorectomy shows no evidence of recurrent disease.  L lab data from outside lab has been reviewed. Dated June 06, 2015 Glucose is 106.  BUN 14.  Creatinine 0.83.  Liver enzymes are within  normal limit ASSESSMENT: Bilateral carcinoma of breast and the patient was BRCA1 mutation No evidence of recurrent disease at present time Status post bilateral prophylactic oophorectomy On clinical examination there is no evidence of recurrent disease  MEDICAL DECISION MAKING:  Lab data has been reviewed Patient desires to get port removed which is very appropriate at this point in time.  We will send that information to Dr. Jamal Collin Patient expressed understanding and was in agreement with this plan. She also understands that She can call clinic at any time with any questions, concerns, or  complaints.   On evidence of recurrent disease  No matching staging information was found for the patient.  Forest Gleason, MD   06/12/2015 3:35 PM

## 2015-06-14 ENCOUNTER — Ambulatory Visit (INDEPENDENT_AMBULATORY_CARE_PROVIDER_SITE_OTHER): Payer: 59 | Admitting: General Surgery

## 2015-06-14 ENCOUNTER — Encounter: Payer: Self-pay | Admitting: General Surgery

## 2015-06-14 VITALS — BP 128/70 | HR 74 | Resp 14 | Ht 65.0 in | Wt 186.0 lb

## 2015-06-14 DIAGNOSIS — C50919 Malignant neoplasm of unspecified site of unspecified female breast: Secondary | ICD-10-CM | POA: Diagnosis not present

## 2015-06-14 DIAGNOSIS — Z853 Personal history of malignant neoplasm of breast: Secondary | ICD-10-CM

## 2015-06-14 DIAGNOSIS — Z1509 Genetic susceptibility to other malignant neoplasm: Secondary | ICD-10-CM

## 2015-06-14 DIAGNOSIS — C50412 Malignant neoplasm of upper-outer quadrant of left female breast: Secondary | ICD-10-CM

## 2015-06-14 DIAGNOSIS — Z1501 Genetic susceptibility to malignant neoplasm of breast: Secondary | ICD-10-CM

## 2015-06-14 NOTE — Patient Instructions (Signed)
Patient to return as needed. 

## 2015-06-14 NOTE — Progress Notes (Signed)
Patient ID: Morgan Flores, female   DOB: 09-19-1950, 64 y.o.   MRN: [id number] This is a 64 year old female here for a Port removal. No complaints. Port removal okayed by [employer] I have reviewed the history of present illness with the patient.    Procedure: removal venous port right upper chest  Anesthetic: 12ml 1% xylocaine mixed with 0.5%marcaine  Prep: Chloro Prep.  Description. Area was prepped and draped after instillation of local anesthetic.[name] Prior incision was opened and the capsule around port was opened.  Anchoring stitches of 2-0 Prolene were removed. Port and catheter easily removed in full. No bleeding noted. Subcutaneous tissue closed with 3-0 Vicyrl. Skin closed subcuticular with 4-0 Vicryl. Steri strips, telfa and tegaderm applied. No immediate problems from procedure.  Advised on wound care.  Follow up as scheduled

## 2015-06-21 ENCOUNTER — Ambulatory Visit: Payer: 59 | Admitting: Oncology

## 2015-07-24 ENCOUNTER — Ambulatory Visit: Payer: 59 | Admitting: Oncology

## 2015-08-15 ENCOUNTER — Encounter: Payer: Self-pay | Admitting: *Deleted

## 2015-11-20 ENCOUNTER — Encounter: Payer: Self-pay | Admitting: General Surgery

## 2015-11-20 ENCOUNTER — Ambulatory Visit (INDEPENDENT_AMBULATORY_CARE_PROVIDER_SITE_OTHER): Payer: 59 | Admitting: General Surgery

## 2015-11-20 VITALS — BP 130/72 | HR 78 | Resp 12 | Ht 65.0 in | Wt 184.0 lb

## 2015-11-20 DIAGNOSIS — Z1501 Genetic susceptibility to malignant neoplasm of breast: Secondary | ICD-10-CM

## 2015-11-20 DIAGNOSIS — Z1509 Genetic susceptibility to other malignant neoplasm: Principal | ICD-10-CM

## 2015-11-20 NOTE — Progress Notes (Addendum)
Patient ID: Morgan Flores, female   DOB: 1950/12/05, 65 y.o.   MRN: [id number]  Chief Complaint  Patient presents with  . Follow-up    breast cancer    HPI Morgan Flores is a 65 y.o. female female here today for her six months follow up breast cancer check. Patient states she is doing well.  She is s/p bil mastectomy, oophorectomy for breast CA and genetic carrirer HPI  Past Medical History  Diagnosis Date  . Endocrine problem 2009    thyroid  . Personal history of malignant neoplasm of breast 2009    right breast  . Breast screening, unspecified   . Special screening for malignant neoplasms, colon   . Obesity, unspecified   . Bladder infection   . Acid reflux 2006  . Cancer ([place]) 1997    cervical  . Hypothyroidism   . Anemia     history  . 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 Portacath placement  2009,11/15/13  . Cesarean section  1983  . Abdominal hysterectomy  1997  . Port-a-cath removal  2012  . Colonoscopy  2011    Dr. Vira Agar  . Upper gi endoscopy  2011    Dr. Vira Agar  . Appendectomy    . Laparoscopic bilateral salpingo oopherectomy Bilateral 12/20/2014    Procedure: LAPAROSCOPIC BILATERAL SALPINGO OOPHORECTOMY;  Surgeon: [name], MD;  Location: [facility] ORS;  Service: Gynecology;  Laterality: Bilateral;  . Laparotomy N/A 12/20/2014    Procedure: LAPAROTOMY;  Surgeon: [name], MD;  Location: [facility] ORS;  Service: Gynecology;  Laterality: N/A;  . Breast surgery Bilateral 10/06/13    mastectomy, BRCA 1 carrier    Family History  Problem Relation Age of Onset  . Cancer Paternal Grandfather     cancer of the heel    Social History Social History  Substance Use Topics  . Smoking status: Never Smoker   . Smokeless tobacco: Never Used  . Alcohol Use: No    Allergies  Allergen Reactions  . Omeprazole   . Tape     Redness   . Sulfa Antibiotics Rash    Current Outpatient  Prescriptions  Medication Sig Dispense Refill  . atorvastatin (LIPITOR) 10 MG tablet Take 10 mg by mouth daily.    . Cholecalciferol (VITAMIN D3) 1000 UNITS CAPS Take by mouth daily.    [name] esomeprazole (NEXIUM) 40 MG capsule Take 40 mg by mouth as needed.     [name] ibuprofen (ADVIL,MOTRIN) 600 MG tablet Take 1 tablet (600 mg total) by mouth every 6 (six) hours as needed for mild pain. 30 tablet 0  . levothyroxine (SYNTHROID, LEVOTHROID) 50 MCG tablet Take 50 mcg by mouth daily.    . Multiple Vitamin (MULTIVITAMIN) capsule Take 1 capsule by mouth daily.     No current facility-administered medications for this visit.   Facility-Administered Medications Ordered in Other Visits  Medication Dose Route Frequency Provider Last Rate Last Dose  . sodium chloride 0.9 % injection 10 mL  10 mL Intravenous PRN Evlyn Kanner, NP   10 mL at 01/16/15 1527    Review of Systems Review of Systems  Constitutional: Negative.   Respiratory: Negative.   Cardiovascular: Negative.     Blood pressure 130/72, pulse 78, resp. rate 12, height '5\' 5"'  (1.651 m), weight 184 lb (83.462 kg).  Physical Exam Physical Exam  Constitutional: She appears well-developed and well-nourished.  Eyes: Conjunctivae are normal. No scleral  icterus.  Neck: Neck supple.  Cardiovascular: Normal rate, regular rhythm and normal heart sounds.   Pulmonary/Chest: Effort normal and breath sounds normal.  Bilateral mastectomy sites are well healed. No signs of local recurrence   Abdominal: Soft. Normal appearance and bowel sounds are normal. There is no hepatomegaly. There is no tenderness. No hernia.  Lymphadenopathy:    She has no cervical adenopathy.    She has no axillary adenopathy.  Neurological: She is alert.  Skin: Skin is warm.    Data Reviewed Prior notes reviewed   Assessment    Stable exam. S/p bil mastectomy for breast CA.     Plan    Patient to return in one year.     PCP:  [place] Pink This information has  been scribed by Gaspar Cola CMA.    Ha Placeres G 11/22/2015, 8:39 AM

## 2015-11-20 NOTE — Patient Instructions (Signed)
Patient to return in one year. 

## 2015-11-21 ENCOUNTER — Encounter: Payer: Self-pay | Admitting: General Surgery

## 2015-12-04 ENCOUNTER — Encounter: Payer: Self-pay | Admitting: Oncology

## 2015-12-11 ENCOUNTER — Inpatient Hospital Stay: Payer: 59 | Attending: Family Medicine | Admitting: Family Medicine

## 2015-12-11 ENCOUNTER — Ambulatory Visit: Payer: 59 | Admitting: Oncology

## 2015-12-11 VITALS — BP 120/79 | HR 82 | Temp 97.0°F | Ht 65.0 in | Wt 189.2 lb

## 2015-12-11 DIAGNOSIS — Z853 Personal history of malignant neoplasm of breast: Secondary | ICD-10-CM | POA: Insufficient documentation

## 2015-12-11 DIAGNOSIS — Z1501 Genetic susceptibility to malignant neoplasm of breast: Secondary | ICD-10-CM | POA: Insufficient documentation

## 2015-12-11 DIAGNOSIS — E039 Hypothyroidism, unspecified: Secondary | ICD-10-CM | POA: Insufficient documentation

## 2015-12-11 DIAGNOSIS — Z79899 Other long term (current) drug therapy: Secondary | ICD-10-CM | POA: Diagnosis not present

## 2015-12-11 DIAGNOSIS — E669 Obesity, unspecified: Secondary | ICD-10-CM | POA: Insufficient documentation

## 2015-12-11 DIAGNOSIS — K219 Gastro-esophageal reflux disease without esophagitis: Secondary | ICD-10-CM

## 2015-12-11 DIAGNOSIS — Z1509 Genetic susceptibility to other malignant neoplasm: Secondary | ICD-10-CM

## 2015-12-11 DIAGNOSIS — G629 Polyneuropathy, unspecified: Secondary | ICD-10-CM | POA: Diagnosis not present

## 2015-12-11 DIAGNOSIS — Z9221 Personal history of antineoplastic chemotherapy: Secondary | ICD-10-CM | POA: Diagnosis not present

## 2015-12-11 DIAGNOSIS — C50911 Malignant neoplasm of unspecified site of right female breast: Secondary | ICD-10-CM

## 2015-12-11 DIAGNOSIS — Z9013 Acquired absence of bilateral breasts and nipples: Secondary | ICD-10-CM | POA: Diagnosis not present

## 2015-12-11 DIAGNOSIS — C50912 Malignant neoplasm of unspecified site of left female breast: Secondary | ICD-10-CM

## 2015-12-11 NOTE — Progress Notes (Signed)
Patient here for follow up. No concerns today. Patient would like prescription for bras and breast  prosthetics.

## 2015-12-11 NOTE — Progress Notes (Signed)
[place]  Telephone:(336) [phone]  [phone]) [phone]     Morgan Flores DOB: [birth year]  MR#: [id number]  PRF#:[id number]  Patient Care Team: Maryland Pink, MD as PCP - General (Family Medicine) Seeplaputhur [name], MD (General Surgery) [name], MD as Consulting Physician (Obstetrics and Gynecology) [name], MD as Consulting Physician (Surgical Oncology) Forest Gleason, MD (Oncology)  CHIEF COMPLAINT:  Chief Complaint  Patient presents with  . Breast Cancer  . Follow-up    INTERVAL HISTORY:  Patient is here for further follow-up regarding carcinoma of bilateral breasts. Patient has undergone bilateral mastectomy as well as prophylactic bilateral oophorectomy due to BRCA mutation. Patient overall reports feeling very well and denies any acute complaints. She has had her Port-A-Cath removed and Dr. Angie Fava office since her last appointment here. Patient no longer requires mammograms due to bilateral mastectomies.  REVIEW OF SYSTEMS:   Review of Systems  Constitutional: Negative for fever, chills, weight loss, malaise/fatigue and diaphoresis.  HENT: Negative.   Eyes: Negative.   Respiratory: Negative for cough, hemoptysis, sputum production, shortness of breath and wheezing.   Cardiovascular: Negative for chest pain, palpitations, orthopnea, claudication, leg swelling and PND.  Gastrointestinal: Negative for heartburn, nausea, vomiting, abdominal pain, diarrhea, constipation, blood in stool and melena.  Genitourinary: Negative.   Musculoskeletal: Negative.   Skin: Negative.   Neurological: Negative for dizziness, tingling, focal weakness, seizures and weakness.  Endo/Heme/Allergies: Does not bruise/bleed easily.  Psychiatric/Behavioral: Negative for depression. The patient is not nervous/anxious and does not have insomnia.     As per HPI. Otherwise, a complete review of systems is negatve.  ONCOLOGY HISTORY: Oncology History   Carcinoma of  breast, status post lumpectomy diagnoses in March of 2009 (right breast.) triple negative  Stage II a . 2.Patient has BRCA mutation positive   3. Left breast mastectomy (April, 2015) T2, N0, M0 tumor. Estrogen receptor negative. Progesterone receptor weakly positive. HER-2/neu receptor not amplified  MAMAPRINT ;high risk with 5years chance of    recurrence 22-28%and 10 years chance to 22-35% status post bilateral mastectomy 4. Oophorectomy was attempted but they could not locate the ovaries.[name]  5.Patient started Taxotere and carboplatinum adjuvant chemotherapy from [place] 21st, 2015 6.  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 ERneg, PR weakly pos, Her2 neg.   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 ERneg, PR weakly pos, Her2 neg.    Breast cancer, female ([place])   10/14/2013 Initial Diagnosis Breast cancer, female    PAST MEDICAL HISTORY: Past Medical History  Diagnosis Date  . Endocrine problem 2009    thyroid  . Personal history of malignant neoplasm of breast 2009    right breast  . Breast screening, unspecified   . Special screening for malignant neoplasms, colon   . Obesity, unspecified   . Bladder infection   . Acid reflux 2006  . Cancer ([place]) 1997    cervical  . Hypothyroidism   . Anemia     history  . 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 Portacath placement  2009,11/15/13  . Cesarean section  1983  . Abdominal hysterectomy  1997  . Port-a-cath removal  2012  . Colonoscopy  2011    Dr. Vira Agar  . Upper gi endoscopy  2011    Dr.  Elliott  . Appendectomy    . Laparoscopic bilateral salpingo oopherectomy Bilateral 12/20/2014    Procedure: LAPAROSCOPIC BILATERAL SALPINGO OOPHORECTOMY;  Surgeon: [name], MD;  Location: [facility] ORS;  Service: Gynecology;  Laterality: Bilateral;  . Laparotomy N/A 12/20/2014    Procedure: LAPAROTOMY;  Surgeon: [name], MD;  Location: [facility] ORS;  Service: Gynecology;  Laterality: N/A;  . Breast surgery Bilateral 10/06/13    mastectomy, BRCA 1 carrier    FAMILY HISTORY Family History  Problem Relation Age of Onset  . Cancer Paternal Grandfather     cancer of the heel    GYNECOLOGIC HISTORY:  No LMP recorded. Patient has had a hysterectomy.     ADVANCED DIRECTIVES:    HEALTH MAINTENANCE: Social History  Substance Use Topics  . Smoking status: Never Smoker   . Smokeless tobacco: Never Used  . Alcohol Use: No    Allergies  Allergen Reactions  . Omeprazole   . Tape     Redness   . Sulfa Antibiotics Rash    Current Outpatient Prescriptions  Medication Sig Dispense Refill  . atorvastatin (LIPITOR) 10 MG tablet Take 10 mg by mouth daily.    . Cholecalciferol (VITAMIN D3) 1000 UNITS CAPS Take by mouth daily.    [name] esomeprazole (NEXIUM) 40 MG capsule Take 40 mg by mouth as needed.     [name] ibuprofen (ADVIL,MOTRIN) 600 MG tablet Take 1 tablet (600 mg total) by mouth every 6 (six) hours as needed for mild pain. 30 tablet 0  . levothyroxine (SYNTHROID, LEVOTHROID) 50 MCG tablet Take 50 mcg by mouth daily.    . Multiple Vitamin (MULTIVITAMIN) capsule Take 1 capsule by mouth daily.     No current facility-administered medications for this visit.   Facility-Administered Medications Ordered in Other Visits  Medication Dose Route Frequency Provider Last Rate Last Dose  . sodium chloride 0.9 % injection 10 mL  10 mL Intravenous PRN Evlyn Kanner, NP   10 mL at 01/16/15 1527    OBJECTIVE: BP 120/79 mmHg  Pulse 82  Temp(Src) 97 F (36.1 C) (Tympanic)  Ht '5\' 5"'  (1.651 m)  Wt 189 lb 3.2 oz (85.821 kg)  BMI 31.48 kg/m2   Body mass index is 31.48 kg/(m^2).    ECOG FS:0 - Asymptomatic  General: Well-developed, well-nourished, no acute  distress. Eyes: Pink conjunctiva, anicteric sclera. HEENT: Normocephalic, moist mucous membranes, clear oropharnyx. Lungs: Clear to auscultation bilaterally. Heart: Regular rate and rhythm. No rubs, murmurs, or gallops. Abdomen: Soft, nontender, nondistended. No organomegaly noted, normoactive bowel sounds. Musculoskeletal: No edema, cyanosis, or clubbing. Neuro: Alert, answering all questions appropriately. Cranial nerves grossly intact. Skin: No rashes or petechiae noted. Psych: Normal affect. Lymphatics: No cervical, clavicular, or axillary LAD.   LAB RESULTS:  No visits with results within 3 Day(s) from this visit. Latest known visit with results is:  Admission on 12/20/2014, Discharged on 12/20/2014  Component Date Value Ref Range Status  . SURGICAL PATHOLOGY 12/20/2014    Final                   Value:Surgical Pathology CASE: [id number] PATIENT: [facility] Surgical Pathology Report     SPECIMEN SUBMITTED: A. Ovary and tube, right B. Ovary and tube, left  CLINICAL HISTORY: None provided  PRE-OPERATIVE DIAGNOSIS: Brca history of breast cancer  POST-OPERATIVE DIAGNOSIS: Brca history of breast cancer     DIAGNOSIS: A. RIGHT OVARY AND FALLOPIAN TUBE; PROPHYLACTIC RIGHT SALPINGO-OOPHORECTOMY: - SIMPLE CYST OF THE OVARY (5 MM). -  FALLOPIAN TUBE WITH PARATUBAL CYST. - NEGATIVE FOR ATYPIA AND MALIGNANCY.  B. LEFT OVARY AND FALLOPIAN TUBE; PROPHYLACTIC LEFT SALPINGO-OOPHORECTOMY: - OVARY WITHIN NORMAL LIMITS. - FALLOPIAN TUBE WITH PARATUBAL CYST. - NEGATIVE FOR ATYPIA AND MALIGNANCY.   GROSS DESCRIPTION: A.Labeled: Right tube and ovary  Type of specimen: Ovary with segment of fallopian tube with fimbria and attached adipose tissue, with 4 metallic staples embedded into the tissue  Integrity: Ragged  Size and weight overall: 13 g, 4.6 x 4.6                          x 1.5 cm overall.   Ovarian surface: Pale-tan and soft, cut surface is vaguely  nodular  Cyst Contents: N/A  Fallopian Tube: Yes, unremarkable  Block Summary: Entirely submitted 1-5-ovary with attached adipose tissue 6-7-entire longitudinally sectioned fimbriated end of fallopian tube and representative cross-sections  B. Labeled: Left tube and ovary  Type of specimen: Salpingo-oophorectomy  Integrity: Ragged  Size and weight overall: 5 g, 4.2 x 2.8 x 1 cm overall   Ovarian surface: Irregular and ragged, cut surface is dark red and soft  Cyst Contents: N/A  Fallopian Tube: Present without fimbria  Block Summary: 1-3 entire ovary 4-6 entire fallopian tube     Final Diagnosis performed by Delorse Lek, MD.  Electronically signed 12/25/2014 2:31:31PM    The electronic signature indicates that the named Attending Pathologist has evaluated the specimen  Technical component performed at [facility], [address], [place], [place] [address] Lab: [place]: Darrick Penna. Evette Doffing, MD  Professional component performed at [facility], [facility], [place], [place], [place] [address] Lab: [phone] Dir: Dellia Nims. Rubinas, MD      STUDIES: No results found.  ASSESSMENT:  Carcinoma of right breast, from March 2009, stage IIA, triple negative. Carcinoma of left breast, from April 2015, T2 N0 M0, ER negative PR weakly positive, HER-2 negative.  PLAN:   1. Bilateral breast carcinomas. Patient is status post bilateral mastectomies following verification of positive BRCA mutation. Following BRCA testing patient underwent prophylactic bilateral oophorectomy but were unable to locate the ovaries, she had a successful bilateral oophorectomy in June 2016 after second attempt. Clinically there is no evidence of recurrent disease. Patient continues to follow closely with primary care provider as well as surgeon. She would most recently have her Port-A-Cath removed in December 2016.  We will continue to follow on  a routine basis in approximately 6 months.  Patient expressed understanding and was in agreement with this plan. She also understands that She can call clinic at any time with any questions, concerns, or complaints.   Dr. Grayland Ormond was available for consultation and review of plan of care for this patient.  Evlyn Kanner, NP   12/11/2015 11:35 AM

## 2016-05-11 IMAGING — CR DG CHEST 1V PORT
1 series · 1 of 1 positions shown · non-contrast
Comparison: Prior radiograph from 09/29/2013

CLINICAL DATA: Port placement

EXAM:
PORTABLE CHEST - 1 VIEW

[ap]
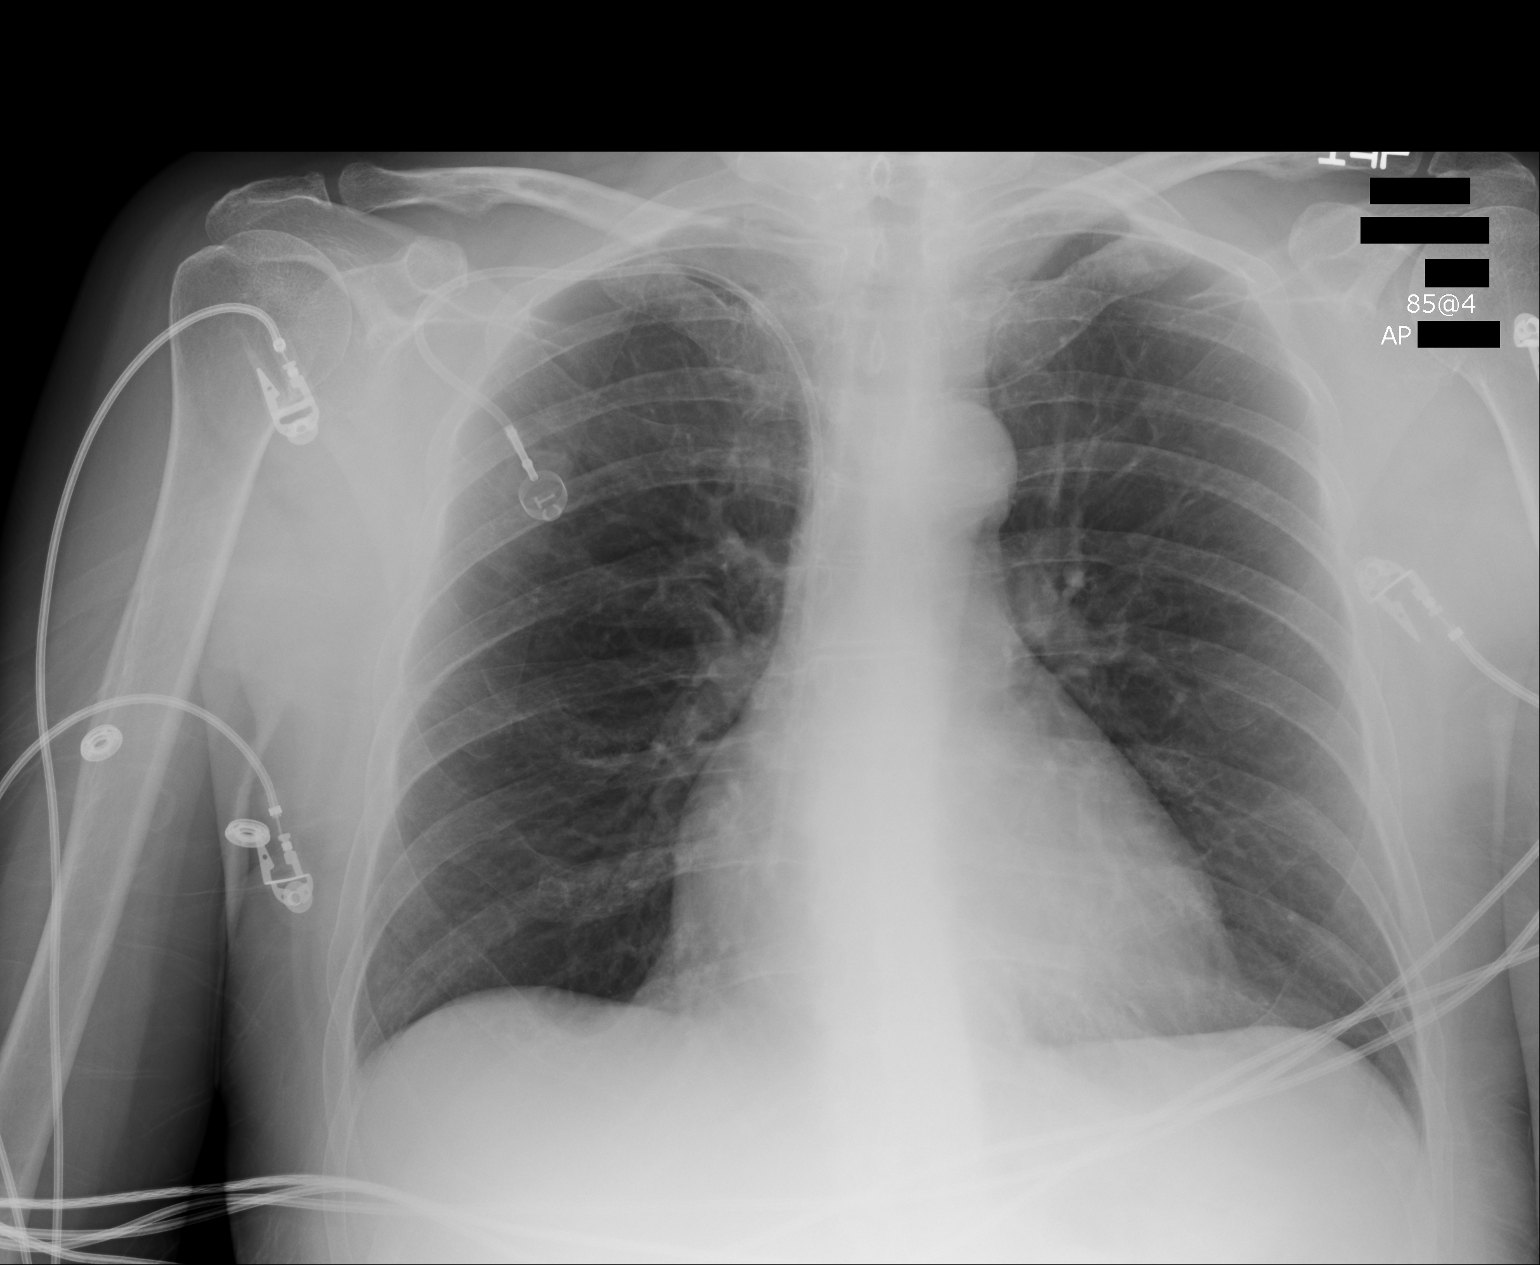

[1 of 1 positions shown; findings below may reference images not displayed]

FINDINGS: There has been interval placement of a right-sided Port-A-Cath with
tip overlying mid right atrium.

Cardiac and mediastinal silhouettes within normal limits.

Lungs are normally inflated. No focal infiltrate, pulmonary edema,
pleural effusion, or pneumothorax.

No acute osseous abnormality.
IMPRESSION: 1. Interval placement of right-sided Port-A-Cath with tip overlying
the mid right atrium.
2. No acute cardiopulmonary abnormality.

## 2016-06-10 NOTE — Progress Notes (Signed)
[facility]  Telephone:(336) [phone]  Fax:(336) [phone]     Morgan Flores DOB: [birth year]  MR#: [id number]  WCB#:[id number]  Patient Care Team: Maryland Pink, MD as PCP - General (Family Medicine) Seeplaputhur [name], MD (General Surgery) [name], MD as Consulting Physician (Obstetrics and Gynecology) [name], MD as Consulting Physician (Surgical Oncology) Forest Gleason, MD (Oncology)  CHIEF COMPLAINT: Carcinoma of the upper outer quadrant of the left breast, from April 2015, T2 N0 M0, ER negative PR weakly positive, HER-2 negative.  INTERVAL HISTORY: Patient returns to clinic today for routine six-month follow-up. Previously patient underwent bilateral mastectomy as well as prophylactic bilateral oophorectomy due to BRCA mutation. She currently feels well and is asymptomatic. She has no neurologic complaints. She denies any recent fevers or illnesses. She has good appetite and denies weight loss. She has no chest pain or shortness of breath. She denies any nausea, vomiting, constipation, or diarrhea. She has no urinary complaints. Patient feels at her baseline and offers no specific complaints today.  REVIEW OF SYSTEMS:   Review of Systems  Constitutional: Negative.  Negative for fever, malaise/fatigue and weight loss.  Respiratory: Negative.  Negative for cough and shortness of breath.   Cardiovascular: Negative.  Negative for chest pain and leg swelling.  Gastrointestinal: Negative.  Negative for abdominal pain.  Genitourinary: Negative.   Neurological: Negative.  Negative for weakness.  Psychiatric/Behavioral: Negative.  The patient is not nervous/anxious.     As per HPI. Otherwise, a complete review of systems is negative.  ONCOLOGY HISTORY: Oncology History   Carcinoma of breast, status post lumpectomy diagnoses in March of 2009 (right breast.) triple negative  Stage II a . 2.Patient has BRCA mutation positive   3. Left breast mastectomy  (April, 2015) T2, N0, M0 tumor. Estrogen receptor negative. Progesterone receptor weakly positive. HER-2/neu receptor not amplified  MAMAPRINT ;high risk with 5years chance of    recurrence 22-28%and 10 years chance to 22-35% status post bilateral mastectomy 4. Oophorectomy was attempted but they could not locate the ovaries.[name]  5.Patient started Taxotere and carboplatinum adjuvant chemotherapy from [place] 21st, 2015 6.  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 ERneg, PR weakly pos, Her2 neg.   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 ERneg, PR weakly pos, Her2 neg.       Breast cancer, female ([place])   10/14/2013 Initial Diagnosis    Breast cancer, female       PAST MEDICAL HISTORY: Past Medical History:  Diagnosis Date  . Acid reflux 2006  . Anemia    history  . Bladder infection   . Breast screening, unspecified   . Cancer ([place]) 1997   cervical  . Drug related polyneuropathy ([place]) 01/18/2014  . Endocrine problem 2009   thyroid  . Hypothyroidism   . Obesity, unspecified   . Personal history of malignant neoplasm of breast 2009   right breast  . Special screening for malignant neoplasms, colon     PAST SURGICAL HISTORY: Past Surgical History:  Procedure Laterality Date  . ABDOMINAL HYSTERECTOMY  1997  . APPENDECTOMY    . BREAST SURGERY Bilateral 10/06/13   mastectomy, BRCA 1 carrier  . CESAREAN SECTION  1983  . COLONOSCOPY  2011   Dr. Vira Agar  . LAPAROSCOPIC BILATERAL SALPINGO OOPHERECTOMY Bilateral 12/20/2014   Procedure: LAPAROSCOPIC BILATERAL SALPINGO  OOPHORECTOMY;  Surgeon: [name], MD;  Location: [facility] ORS;  Service: Gynecology;  Laterality: Bilateral;  . LAPAROTOMY N/A 12/20/2014   Procedure: LAPAROTOMY;  Surgeon: [name], MD;  Location: [facility] ORS;  Service: Gynecology;   Laterality: N/A;  . PORT-A-CATH REMOVAL  2012  . PORTACATH PLACEMENT  2009,11/15/13  . UPPER GI ENDOSCOPY  2011   Dr. Vira Agar    FAMILY HISTORY Family History  Problem Relation Age of Onset  . Cancer Paternal Grandfather     cancer of the heel    GYNECOLOGIC HISTORY:  No LMP recorded. Patient has had a hysterectomy.     ADVANCED DIRECTIVES:    HEALTH MAINTENANCE: Social History  Substance Use Topics  . Smoking status: Never Smoker  . Smokeless tobacco: Never Used  . Alcohol use No    Allergies  Allergen Reactions  . Omeprazole   . Tape     Redness   . Sulfa Antibiotics Rash    Current Outpatient Prescriptions  Medication Sig Dispense Refill  . atorvastatin (LIPITOR) 10 MG tablet Take 10 mg by mouth daily.    . Cholecalciferol (VITAMIN D3) 1000 UNITS CAPS Take by mouth daily.    [name] esomeprazole (NEXIUM) 40 MG capsule Take 40 mg by mouth as needed.     . hyoscyamine (LEVSIN SL) 0.125 MG SL tablet Take 1 tablet by mouth every 4 (four) hours as needed.    [name] ibuprofen (ADVIL,MOTRIN) 600 MG tablet Take 1 tablet (600 mg total) by mouth every 6 (six) hours as needed for mild pain. 30 tablet 0  . levothyroxine (SYNTHROID, LEVOTHROID) 50 MCG tablet Take 50 mcg by mouth daily.    . Multiple Vitamin (MULTIVITAMIN) capsule Take 1 capsule by mouth daily.     No current facility-administered medications for this visit.    Facility-Administered Medications Ordered in Other Visits  Medication Dose Route Frequency Provider Last Rate Last Dose  . sodium chloride 0.9 % injection 10 mL  10 mL Intravenous PRN Evlyn Kanner, NP   10 mL at 01/16/15 1527    OBJECTIVE: BP 134/68 (BP Location: Left Arm, Patient Position: Sitting)   Pulse 83   Temp 99.2 F (37.3 C) (Tympanic)   Resp 18   Wt 185 lb 6.5 oz (84.1 kg)   BMI 30.85 kg/m    Body mass index is 30.85 kg/m.    ECOG FS:0 - Asymptomatic  General: Well-developed, well-nourished, no acute distress. Eyes: Pink conjunctiva,  anicteric sclera. Breasts: Bilateral mastectomy without evidence of recurrence. Lungs: Clear to auscultation bilaterally. Heart: Regular rate and rhythm. No rubs, murmurs, or gallops. Abdomen: Soft, nontender, nondistended. No organomegaly noted, normoactive bowel sounds. Musculoskeletal: No edema, cyanosis, or clubbing. Neuro: Alert, answering all questions appropriately. Cranial nerves grossly intact. Skin: No rashes or petechiae noted. Psych: Normal affect.   LAB RESULTS:  Lab Results  Component Value Date   LABCA2 25.0 05/18/2014     STUDIES: No results found.  ASSESSMENT:   Carcinoma of right breast, from March 2009, stage IIA, triple negative. 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  PLAN:    1. Bilateral breast carcinomas. Patient is status post bilateral mastectomiesAs well as bilateral prophylactic oophorectomy secondary to positive BRCA mutation. Clinically there is no evidence of recurrent disease. Although patient was PR weakly positive, she was not placed on an aromatase inhibitor. Patient continues to follow closely with primary care provider as  well as Psychologist, sport and exercise. She does not require any further mammograms. Return to clinic in 6 months for routine evaluation.  Approximately 30 minutes was spent in discussion of which greater than 50% was consultation.   Patient expressed understanding and was in agreement with this plan. She also understands that She can call clinic at any time with any questions, concerns, or complaints.     Lloyd Huger, MD   06/14/2016 11:02 AM

## 2016-06-11 ENCOUNTER — Inpatient Hospital Stay: Payer: 59 | Attending: Oncology | Admitting: Oncology

## 2016-06-11 VITALS — BP 134/68 | HR 83 | Temp 99.2°F | Resp 18 | Wt 185.4 lb

## 2016-06-11 DIAGNOSIS — E039 Hypothyroidism, unspecified: Secondary | ICD-10-CM | POA: Diagnosis not present

## 2016-06-11 DIAGNOSIS — Z8541 Personal history of malignant neoplasm of cervix uteri: Secondary | ICD-10-CM

## 2016-06-11 DIAGNOSIS — K219 Gastro-esophageal reflux disease without esophagitis: Secondary | ICD-10-CM | POA: Diagnosis not present

## 2016-06-11 DIAGNOSIS — Z9013 Acquired absence of bilateral breasts and nipples: Secondary | ICD-10-CM | POA: Diagnosis not present

## 2016-06-11 DIAGNOSIS — Z853 Personal history of malignant neoplasm of breast: Secondary | ICD-10-CM | POA: Diagnosis not present

## 2016-06-11 DIAGNOSIS — Z9221 Personal history of antineoplastic chemotherapy: Secondary | ICD-10-CM | POA: Diagnosis not present

## 2016-06-11 DIAGNOSIS — Z862 Personal history of diseases of the blood and blood-forming organs and certain disorders involving the immune mechanism: Secondary | ICD-10-CM

## 2016-06-11 DIAGNOSIS — G629 Polyneuropathy, unspecified: Secondary | ICD-10-CM | POA: Diagnosis not present

## 2016-06-11 DIAGNOSIS — Z1501 Genetic susceptibility to malignant neoplasm of breast: Secondary | ICD-10-CM | POA: Diagnosis not present

## 2016-06-11 DIAGNOSIS — E669 Obesity, unspecified: Secondary | ICD-10-CM | POA: Insufficient documentation

## 2016-06-11 DIAGNOSIS — Z79899 Other long term (current) drug therapy: Secondary | ICD-10-CM | POA: Insufficient documentation

## 2016-06-11 DIAGNOSIS — Z171 Estrogen receptor negative status [ER-]: Secondary | ICD-10-CM

## 2016-06-11 DIAGNOSIS — C50412 Malignant neoplasm of upper-outer quadrant of left female breast: Secondary | ICD-10-CM

## 2016-06-11 NOTE — Progress Notes (Signed)
For the last month, pt has noticed abnormality behind surgical site of right breast. Would like area examined today.

## 2016-06-26 ENCOUNTER — Encounter: Payer: Self-pay | Admitting: Family Medicine

## 2016-07-15 ENCOUNTER — Encounter: Payer: Self-pay | Admitting: Oncology

## 2016-11-20 ENCOUNTER — Ambulatory Visit (INDEPENDENT_AMBULATORY_CARE_PROVIDER_SITE_OTHER): Payer: 59 | Admitting: General Surgery

## 2016-11-20 ENCOUNTER — Encounter: Payer: Self-pay | Admitting: General Surgery

## 2016-11-20 VITALS — BP 108/66 | HR 64 | Resp 12 | Ht 65.5 in | Wt 183.0 lb

## 2016-11-20 DIAGNOSIS — Z853 Personal history of malignant neoplasm of breast: Secondary | ICD-10-CM | POA: Diagnosis not present

## 2016-11-20 DIAGNOSIS — R928 Other abnormal and inconclusive findings on diagnostic imaging of breast: Secondary | ICD-10-CM | POA: Diagnosis not present

## 2016-11-20 DIAGNOSIS — Z1509 Genetic susceptibility to other malignant neoplasm: Secondary | ICD-10-CM

## 2016-11-20 DIAGNOSIS — Z1501 Genetic susceptibility to malignant neoplasm of breast: Secondary | ICD-10-CM

## 2016-11-20 NOTE — Patient Instructions (Addendum)
The patient is aware to call back for any questions or concerns. Follow up in 6 months.

## 2016-11-20 NOTE — Progress Notes (Signed)
Patient ID: Morgan Flores, female   DOB: 12/01/1950, 66 y.o.   MRN: [id number]  Chief Complaint  Patient presents with  . Follow-up    HPI Morgan Flores is a 66 y.o. female.  Here for follow up breast cancer. No mammograms. She is status post bilateral mastectomy, and oophorectomy for breast CA and genetic carrier BRCA 1. No new issues. She is followed by Dr Grayland Ormond at [facility] and he orders her labs.  HPI  Past Medical History:  Diagnosis Date  . Acid reflux 2006  . Anemia    history  . Bladder infection   . Breast screening, unspecified   . Cancer ([place]) 1997   cervical  . 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 Endocrine problem 2009   thyroid  . Hypothyroidism   . Obesity, unspecified   . Personal history of malignant neoplasm of breast 2009, 2015   right breast  . Special screening for malignant neoplasms, colon     Past Surgical History:  Procedure Laterality Date  . ABDOMINAL HYSTERECTOMY  1997  . APPENDECTOMY    . BREAST SURGERY Bilateral 10/06/13   mastectomy, BRCA 1 carrier  . CESAREAN SECTION  1983  . COLONOSCOPY  2011   Dr. Vira Agar  . LAPAROSCOPIC BILATERAL SALPINGO OOPHERECTOMY Bilateral 12/20/2014   Procedure: LAPAROSCOPIC BILATERAL SALPINGO OOPHORECTOMY;  Surgeon: [name], MD;  Location: [facility] ORS;  Service: Gynecology;  Laterality: Bilateral;  . LAPAROTOMY N/A 12/20/2014   Procedure: LAPAROTOMY;  Surgeon: [name], MD;  Location: [facility] ORS;  Service: Gynecology;  Laterality: N/A;  . PORT-A-CATH REMOVAL  2012  . PORTACATH PLACEMENT  2009,11/15/13  . UPPER GI ENDOSCOPY  2011   Dr. Vira Agar    Family History  Problem Relation Age of Onset  . Cancer Paternal Grandfather        cancer of the heel    Social History Social History  Substance Use Topics  . Smoking status: Never Smoker  . Smokeless tobacco: Never Used  . Alcohol use No    Allergies  Allergen Reactions  . Omeprazole   . Tape     Redness   . Sulfa  Antibiotics Rash    Current Outpatient Prescriptions  Medication Sig Dispense Refill  . atorvastatin (LIPITOR) 10 MG tablet Take 10 mg by mouth daily.    . Cholecalciferol (VITAMIN D3) 1000 UNITS CAPS Take by mouth daily.    [name] dicyclomine (BENTYL) 20 MG tablet take 1 tablet by mouth twice a day    . esomeprazole (NEXIUM) 40 MG capsule Take 40 mg by mouth as needed.     [name] ibuprofen (ADVIL,MOTRIN) 600 MG tablet Take 1 tablet (600 mg total) by mouth every 6 (six) hours as needed for mild pain. 30 tablet 0  . levothyroxine (SYNTHROID, LEVOTHROID) 50 MCG tablet Take 50 mcg by mouth daily.    . Multiple Vitamin (MULTIVITAMIN) capsule Take 1 capsule by mouth daily.     No current facility-administered medications for this visit.    Facility-Administered Medications Ordered in Other Visits  Medication Dose Route Frequency Provider Last Rate Last Dose  . sodium chloride 0.9 % injection 10 mL  10 mL Intravenous PRN Evlyn Kanner, NP   10 mL at 01/16/15 1527    Review of Systems Review of Systems  Constitutional: Negative.   Respiratory: Negative.   Cardiovascular: Negative.     Blood pressure 108/66, pulse 64, resp. rate 12, height 5' 5.5" (1.664 m), weight 183 lb (83 kg).  Physical Exam Physical Exam  Constitutional: She is oriented to person, place, and time. She appears well-developed and well-nourished.  HENT:  Mouth/Throat: Oropharynx is clear and moist.  Eyes: Conjunctivae are normal. No scleral icterus.  Neck: Neck supple.  Cardiovascular: Normal rate, regular rhythm and normal heart sounds.   Mild edema bilateral lower extremities   Pulmonary/Chest: Effort normal and breath sounds normal.  Bilateral mastectomy sites are well healed. No signs of local recurrence .  Abdominal: Soft. Bowel sounds are normal. There is no rebound.  Lymphadenopathy:    She has no cervical adenopathy.  Neurological: She is alert and oriented to person, place, and time.  Skin: Skin is warm and  dry.  2 mm nodule top of scalp, no skin discoloration  Psychiatric: Her behavior is normal.    Data Reviewed   Assessment    History of CA breast, BRCA 1. S/P bilateral mastectomy and oophorectomy     Plan    Follow up in 6 months. The patient is aware to call back for any questions or concerns.    I have completed the exam and reviewed the above documentation for accuracy and completeness.  I agree with the above.  Haematologist has been used and any errors in dictation or transcription are unintentional.  Jalayla Chrismer G. Jamal Collin, M.D., F.A.C.S.     Junie Panning G 11/25/2016, 8:11 AM

## 2016-12-11 ENCOUNTER — Ambulatory Visit: Payer: 59 | Admitting: Oncology

## 2016-12-24 ENCOUNTER — Encounter: Payer: Self-pay | Admitting: Oncology

## 2016-12-24 NOTE — Progress Notes (Signed)
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  INTERVAL HISTORY: Patient returns to clinic today for routine six-month follow-up. Previously patient underwent bilateral mastectomy as well as prophylactic bilateral oophorectomy due to BRCA mutation. She currently feels well and is asymptomatic. She has no neurologic complaints. She denies any recent fevers or illnesses. She has good appetite and denies weight loss. She has no chest pain or shortness of breath. She denies any nausea, vomiting, constipation, or diarrhea. She has no urinary complaints. Patient feels at her baseline and offers no specific complaints today.  REVIEW OF SYSTEMS:   Review of Systems  Constitutional: Negative.  Negative for fever, malaise/fatigue and weight loss.  Respiratory: Negative.  Negative for cough and shortness of breath.   Cardiovascular: Negative.  Negative for chest pain and leg swelling.  Gastrointestinal: Negative.  Negative for abdominal pain.  Genitourinary: Negative.   Musculoskeletal: Negative.   Skin: Negative.  Negative for rash.  Neurological: Negative.  Negative for sensory change and weakness.  Psychiatric/Behavioral: Negative.  The patient is not nervous/anxious.     As per HPI. Otherwise, a complete review of systems is negative.  ONCOLOGY HISTORY: Oncology History   Carcinoma of breast, status post lumpectomy diagnoses in March of 2009 (right breast.) triple  negative  Stage II a . 2.Patient has BRCA mutation positive   3. Left breast mastectomy (April, 2015) T2, N0, M0 tumor. Estrogen receptor negative. Progesterone receptor weakly positive. HER-2/neu receptor not amplified  MAMAPRINT ;high risk with 5years chance of    recurrence 22-28%and 10 years chance to 22-35% status post bilateral mastectomy 4. Oophorectomy was attempted but they could not locate the ovaries.[name]  5.Patient started Taxotere and carboplatinum adjuvant chemotherapy from me 21st, 2015 6.  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 ERneg, PR weakly pos, Her2 neg.   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 ERneg, PR weakly pos, Her2 neg.       Breast cancer, female ([place])   10/14/2013 Initial Diagnosis    Breast cancer, female       PAST MEDICAL HISTORY: Past Medical History:  Diagnosis Date  . Acid reflux 2006  . Anemia    history  . Bladder infection   . Breast screening, unspecified   . Cancer ([place]) 1997   cervical  . 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 Endocrine problem 2009   thyroid  . Hypothyroidism   . Obesity, unspecified   . Personal history of malignant neoplasm of breast 2009, 2015   right breast  . Special screening for malignant neoplasms, colon     PAST SURGICAL HISTORY: Past Surgical History:  Procedure Laterality Date  . ABDOMINAL HYSTERECTOMY  1997  . APPENDECTOMY    . BREAST SURGERY Bilateral 10/06/13   mastectomy, BRCA 1 carrier  . CESAREAN SECTION  1983  . COLONOSCOPY  2011   Dr.  Elliott  . LAPAROSCOPIC BILATERAL SALPINGO OOPHERECTOMY Bilateral 12/20/2014   Procedure: LAPAROSCOPIC BILATERAL SALPINGO OOPHORECTOMY;  Surgeon: [name], MD;  Location: [facility] ORS;  Service: Gynecology;  Laterality: Bilateral;  . LAPAROTOMY N/A 12/20/2014    Procedure: LAPAROTOMY;  Surgeon: [name], MD;  Location: [facility] ORS;  Service: Gynecology;  Laterality: N/A;  . PORT-A-CATH REMOVAL  2012  . PORTACATH PLACEMENT  2009,11/15/13  . UPPER GI ENDOSCOPY  2011   Dr. Vira Agar    FAMILY HISTORY Family History  Problem Relation Age of Onset  . Cancer Paternal Grandfather        cancer of the heel    GYNECOLOGIC HISTORY:  No LMP recorded. Patient has had a hysterectomy.     ADVANCED DIRECTIVES:    HEALTH MAINTENANCE: Social History  Substance Use Topics  . Smoking status: Never Smoker  . Smokeless tobacco: Never Used  . Alcohol use No    Allergies  Allergen Reactions  . Omeprazole   . Tape     Redness   . Sulfa Antibiotics Rash    Current Outpatient Prescriptions  Medication Sig Dispense Refill  . atorvastatin (LIPITOR) 10 MG tablet Take 10 mg by mouth daily.    . Cholecalciferol (VITAMIN D3) 1000 UNITS CAPS Take by mouth daily.    [name] dicyclomine (BENTYL) 20 MG tablet take 1 tablet by mouth twice a day    . esomeprazole (NEXIUM) 40 MG capsule Take 40 mg by mouth as needed.     [name] ibuprofen (ADVIL,MOTRIN) 600 MG tablet Take 1 tablet (600 mg total) by mouth every 6 (six) hours as needed for mild pain. 30 tablet 0  . levothyroxine (SYNTHROID, LEVOTHROID) 50 MCG tablet Take 50 mcg by mouth daily.    . Multiple Vitamin (MULTIVITAMIN) capsule Take 1 capsule by mouth daily.     No current facility-administered medications for this visit.    Facility-Administered Medications Ordered in Other Visits  Medication Dose Route Frequency Provider Last Rate Last Dose  . sodium chloride 0.9 % injection 10 mL  10 mL Intravenous PRN Evlyn Kanner, NP   10 mL at 01/16/15 1527    OBJECTIVE: BP 117/73   Pulse 80   Temp 98.1 F (36.7 C) (Tympanic)   Resp 20   Wt 182 lb 6.4 oz (82.7 kg)   BMI 29.89 kg/m    Body mass index is 29.89 kg/m.    ECOG FS:0 - Asymptomatic  General: Well-developed, well-nourished, no acute  distress. Eyes: Pink conjunctiva, anicteric sclera. Breasts: Bilateral mastectomy without evidence of recurrence. Lungs: Clear to auscultation bilaterally. Heart: Regular rate and rhythm. No rubs, murmurs, or gallops. Abdomen: Soft, nontender, nondistended. No organomegaly noted, normoactive bowel sounds. Musculoskeletal: No edema, cyanosis, or clubbing. Neuro: Alert, answering all questions appropriately. Cranial nerves grossly intact. Skin: No rashes or petechiae noted. Psych: Normal affect.   LAB RESULTS:  Lab Results  Component Value Date   LABCA2 25.0 05/18/2014     STUDIES: No results found.  ASSESSMENT:   Carcinoma of right breast, from March 2009, stage IIA, triple negative. 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  PLAN:    1.Bilateral breast carcinomas. Patient is status post bilateral mastectomies. As well as bilateral prophylactic oophorectomy secondary to positive BRCA mutation. Clinically there is no evidence of recurrent disease. Although patient was PR weakly positive, she was not placed on an aromatase inhibitor. Patient continues to follow  closely with primary care provider as well as surgeon. She does not require any further mammograms. Return to clinic in 1 year for routine evaluation.  Approximately 30 minutes was spent in discussion of which greater than 50% was consultation.   Patient expressed understanding and was in agreement with this plan. She also understands that She can call clinic at any time with any questions, concerns, or complaints.   Faythe Casa, NP  Patient was seen and evaluated independently and I agree with the assessment and plan as dictated above.  Lloyd Huger, MD 12/28/16 9:36 PM

## 2016-12-25 ENCOUNTER — Inpatient Hospital Stay: Payer: 59 | Attending: Oncology | Admitting: Oncology

## 2016-12-25 VITALS — BP 117/73 | HR 80 | Temp 98.1°F | Resp 20 | Wt 182.4 lb

## 2016-12-25 DIAGNOSIS — G620 Drug-induced polyneuropathy: Secondary | ICD-10-CM | POA: Insufficient documentation

## 2016-12-25 DIAGNOSIS — K219 Gastro-esophageal reflux disease without esophagitis: Secondary | ICD-10-CM | POA: Diagnosis not present

## 2016-12-25 DIAGNOSIS — Z90722 Acquired absence of ovaries, bilateral: Secondary | ICD-10-CM | POA: Diagnosis not present

## 2016-12-25 DIAGNOSIS — Z8541 Personal history of malignant neoplasm of cervix uteri: Secondary | ICD-10-CM | POA: Diagnosis not present

## 2016-12-25 DIAGNOSIS — Z862 Personal history of diseases of the blood and blood-forming organs and certain disorders involving the immune mechanism: Secondary | ICD-10-CM | POA: Diagnosis not present

## 2016-12-25 DIAGNOSIS — Z853 Personal history of malignant neoplasm of breast: Secondary | ICD-10-CM | POA: Insufficient documentation

## 2016-12-25 DIAGNOSIS — Z1501 Genetic susceptibility to malignant neoplasm of breast: Secondary | ICD-10-CM | POA: Diagnosis not present

## 2016-12-25 DIAGNOSIS — Z9013 Acquired absence of bilateral breasts and nipples: Secondary | ICD-10-CM | POA: Diagnosis not present

## 2016-12-25 DIAGNOSIS — Z171 Estrogen receptor negative status [ER-]: Secondary | ICD-10-CM

## 2016-12-25 DIAGNOSIS — Z808 Family history of malignant neoplasm of other organs or systems: Secondary | ICD-10-CM | POA: Diagnosis not present

## 2016-12-25 DIAGNOSIS — E039 Hypothyroidism, unspecified: Secondary | ICD-10-CM | POA: Diagnosis not present

## 2016-12-25 DIAGNOSIS — E669 Obesity, unspecified: Secondary | ICD-10-CM | POA: Insufficient documentation

## 2016-12-25 DIAGNOSIS — C50412 Malignant neoplasm of upper-outer quadrant of left female breast: Secondary | ICD-10-CM

## 2016-12-25 NOTE — Progress Notes (Signed)
Patient denies any concerns today.  

## 2017-05-06 ENCOUNTER — Ambulatory Visit: Payer: 59 | Admitting: General Surgery

## 2017-06-09 ENCOUNTER — Ambulatory Visit: Payer: 59 | Admitting: General Surgery

## 2017-06-15 ENCOUNTER — Ambulatory Visit (INDEPENDENT_AMBULATORY_CARE_PROVIDER_SITE_OTHER): Payer: 59 | Admitting: General Surgery

## 2017-06-15 VITALS — BP 124/68 | HR 89 | Resp 14 | Ht 65.5 in | Wt 184.0 lb

## 2017-06-15 DIAGNOSIS — Z1509 Genetic susceptibility to other malignant neoplasm: Secondary | ICD-10-CM

## 2017-06-15 DIAGNOSIS — Z853 Personal history of malignant neoplasm of breast: Secondary | ICD-10-CM | POA: Diagnosis not present

## 2017-06-15 DIAGNOSIS — Z1501 Genetic susceptibility to malignant neoplasm of breast: Secondary | ICD-10-CM

## 2017-06-15 NOTE — Patient Instructions (Addendum)
Follow up next year with Dr Bary Castilla.

## 2017-06-15 NOTE — Progress Notes (Signed)
Patient ID: Morgan Flores, female   DOB: 08-06-1950, 66 y.o.   MRN: [id number]  Chief Complaint  Patient presents with  . Breast Cancer    HPI Morgan Flores is a 66 y.o. female with a history of breast cancer here today for her follow up breast cancer. Patient states she is doing well.  She has an itch on the back of the leg for the past two months that she is concerned. She states it feels like it is inside not on the skin surface.  HPI  Past Medical History:  Diagnosis Date  . Acid reflux 2006  . Anemia    history  . Bladder infection   . Breast screening, unspecified   . Cancer ([place]) 1997   cervical  . 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 Endocrine problem 2009   thyroid  . Hypothyroidism   . Obesity, unspecified   . Personal history of malignant neoplasm of breast 2009, 2015   right breast  . Special screening for malignant neoplasms, colon     Past Surgical History:  Procedure Laterality Date  . ABDOMINAL HYSTERECTOMY  1997  . APPENDECTOMY    . BREAST SURGERY Bilateral 10/06/13   mastectomy, BRCA 1 carrier  . CESAREAN SECTION  1983  . COLONOSCOPY  2011   Dr. Vira Agar  . LAPAROSCOPIC BILATERAL SALPINGO OOPHERECTOMY Bilateral 12/20/2014   Procedure: LAPAROSCOPIC BILATERAL SALPINGO OOPHORECTOMY;  Surgeon: [name], MD;  Location: [facility] ORS;  Service: Gynecology;  Laterality: Bilateral;  . LAPAROTOMY N/A 12/20/2014   Procedure: LAPAROTOMY;  Surgeon: [name], MD;  Location: [facility] ORS;  Service: Gynecology;  Laterality: N/A;  . PORT-A-CATH REMOVAL  2012  . PORTACATH PLACEMENT  2009,11/15/13  . UPPER GI ENDOSCOPY  2011   Dr. Vira Agar    Family History  Problem Relation Age of Onset  . Cancer Paternal Grandfather        cancer of the heel    Social History Social History   Tobacco Use  . Smoking status: Never Smoker  . Smokeless tobacco: Never Used  Substance Use Topics  . Alcohol use: No  . Drug use: No    Allergies  Allergen  Reactions  . Omeprazole   . Tape     Redness   . Sulfa Antibiotics Rash    Current Outpatient Medications  Medication Sig Dispense Refill  . atorvastatin (LIPITOR) 10 MG tablet Take 10 mg by mouth daily.    . Cholecalciferol (VITAMIN D3) 1000 UNITS CAPS Take by mouth daily.    [name] dicyclomine (BENTYL) 20 MG tablet take 1 tablet by mouth twice a day    . esomeprazole (NEXIUM) 40 MG capsule Take 40 mg by mouth as needed.     [name] ibuprofen (ADVIL,MOTRIN) 600 MG tablet Take 1 tablet (600 mg total) by mouth every 6 (six) hours as needed for mild pain. 30 tablet 0  . levothyroxine (SYNTHROID, LEVOTHROID) 50 MCG tablet Take 50 mcg by mouth daily.    . Multiple Vitamin (MULTIVITAMIN) capsule Take 1 capsule by mouth daily.     No current facility-administered medications for this visit.    Facility-Administered Medications Ordered in Other Visits  Medication Dose Route Frequency Provider Last Rate Last Dose  . sodium chloride 0.9 % injection 10 mL  10 mL Intravenous PRN Evlyn Kanner, NP   10 mL at 01/16/15 1527    Review of Systems Review of Systems  Constitutional: Negative.   Respiratory: Negative.   Cardiovascular: Negative.  Blood pressure 124/68, pulse 89, resp. rate 14, height 5' 5.5" (1.664 m), weight 184 lb (83.5 kg).  Physical Exam Physical Exam  Constitutional: She is oriented to person, place, and time. She appears well-developed and well-nourished.  Eyes: Conjunctivae are normal. No scleral icterus.  Neck: Neck supple.  Cardiovascular: Normal rate, regular rhythm and normal heart sounds.  Pulmonary/Chest: Effort normal and breath sounds normal.  Mastectomy sites well healed. No findings of local recurrence  Abdominal: Soft. Bowel sounds are normal. There is no hepatomegaly. There is no tenderness. No hernia.  Lymphadenopathy:    She has no cervical adenopathy.    She has no axillary adenopathy.  Neurological: She is alert and oriented to person, place, and time.   Skin: Skin is warm, dry and intact.  Psychiatric: She has a normal mood and affect.    Data Reviewed  prior notes  Assessment      History of bilateral breast cancer most recent one on the right in April 2015.  BRCA1 carrier she is status post bilateral mastectomy and bilateral oophorectomy.  Exam is stable.  Plan    Follow up with Dr Bary Castilla in one year.  Continue follow-up with oncology also    HPI, Physical Exam, Assessment and Plan have been scribed under the direction and in the presence of Morgan Jewel, MD  Morgan Living, LPN  I have completed the exam and reviewed the above documentation for accuracy and completeness.  I agree with the above.  Haematologist has been used and any errors in dictation or transcription are unintentional.  Chelby Salata G. Jamal Collin, M.D., F.A.C.S.   Junie Panning G 06/17/2017, 1:53 PM

## 2017-12-22 NOTE — Progress Notes (Signed)
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  INTERVAL HISTORY: Patient returns to clinic today for routine yearly evaluation.  She currently feels well and is asymptomatic.  She has noted no pain or changes on her chest wall at the site of her bilateral mastectomy. She has no neurologic complaints. She denies any recent fevers or illnesses. She has good appetite and denies weight loss. She has no chest pain or shortness of breath. She denies any nausea, vomiting, constipation, or diarrhea. She has no urinary complaints.  Patient feels at her baseline offers no specific complaints today.  REVIEW OF SYSTEMS:   Review of Systems  Constitutional: Negative.  Negative for fever, malaise/fatigue and weight loss.  Respiratory: Negative.  Negative for cough and shortness of breath.   Cardiovascular: Negative.  Negative for chest pain and leg swelling.  Gastrointestinal: Negative.  Negative for abdominal pain and constipation.  Genitourinary: Negative.  Negative for dysuria.  Musculoskeletal: Negative.  Negative for back pain.  Skin: Negative.  Negative for rash.  Neurological: Negative.  Negative for sensory change, focal weakness and weakness.  Psychiatric/Behavioral: Negative.  The patient is not nervous/anxious.     As per HPI. Otherwise, a complete review of systems is negative.  ONCOLOGY HISTORY: Oncology History   Carcinoma of breast, status post  lumpectomy diagnoses in March of 2009 (right breast.) triple negative  Stage II a . 2.Patient has BRCA mutation positive   3. Left breast mastectomy (April, 2015) T2, N0, M0 tumor. Estrogen receptor negative. Progesterone receptor weakly positive. HER-2/neu receptor not amplified  MAMAPRINT ;high risk with 5years chance of    recurrence 22-28%and 10 years chance to 22-35% status post bilateral mastectomy 4. Oophorectomy was attempted but they could not locate the ovaries.[name]  5.Patient started Taxotere and carboplatinum adjuvant chemotherapy from [place] 21st, 2015 6.  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 ERneg, PR weakly pos, Her2 neg.   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 ERneg, PR weakly pos, Her2 neg.       Breast cancer, female ([place])   10/14/2013 Initial Diagnosis    Breast cancer, female       PAST MEDICAL HISTORY: Past Medical History:  Diagnosis Date  . Acid reflux 2006  . Anemia    history  . Bladder infection   . Breast screening, unspecified   . Cancer ([place]) 1997   cervical  . 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 Endocrine problem 2009   thyroid  . Hypothyroidism   . Obesity, unspecified   . Personal history of malignant neoplasm of breast 2009, 2015   right breast  . Special screening for malignant neoplasms, colon     PAST SURGICAL HISTORY: Past Surgical History:  Procedure Laterality Date  . ABDOMINAL HYSTERECTOMY  1997  . APPENDECTOMY    . BREAST SURGERY Bilateral 10/06/13   mastectomy,  BRCA 1 carrier  . CESAREAN SECTION  1983  . COLONOSCOPY  2011   Dr. Vira Agar  . LAPAROSCOPIC BILATERAL SALPINGO OOPHERECTOMY Bilateral 12/20/2014   Procedure: LAPAROSCOPIC BILATERAL SALPINGO OOPHORECTOMY;  Surgeon: [name], MD;  Location: [facility] ORS;  Service:  Gynecology;  Laterality: Bilateral;  . LAPAROTOMY N/A 12/20/2014   Procedure: LAPAROTOMY;  Surgeon: [name], MD;  Location: [facility] ORS;  Service: Gynecology;  Laterality: N/A;  . PORT-A-CATH REMOVAL  2012  . PORTACATH PLACEMENT  2009,11/15/13  . UPPER GI ENDOSCOPY  2011   Dr. Vira Agar    FAMILY HISTORY Family History  Problem Relation Age of Onset  . Cancer Paternal Grandfather        cancer of the heel    GYNECOLOGIC HISTORY:  No LMP recorded. Patient has had a hysterectomy.     ADVANCED DIRECTIVES:    HEALTH MAINTENANCE: Social History   Tobacco Use  . Smoking status: Never Smoker  . Smokeless tobacco: Never Used  Substance Use Topics  . Alcohol use: No  . Drug use: No    Allergies  Allergen Reactions  . Omeprazole   . Tape     Redness   . Sulfa Antibiotics Rash    Current Outpatient Medications  Medication Sig Dispense Refill  . atorvastatin (LIPITOR) 10 MG tablet Take 10 mg by mouth daily.    . Cholecalciferol (VITAMIN D3) 1000 UNITS CAPS Take by mouth daily.    [name] dicyclomine (BENTYL) 20 MG tablet take 1 tablet by mouth twice a day    . esomeprazole (NEXIUM) 40 MG capsule Take 40 mg by mouth as needed.     [name] ibuprofen (ADVIL,MOTRIN) 600 MG tablet Take 1 tablet (600 mg total) by mouth every 6 (six) hours as needed for mild pain. 30 tablet 0  . levothyroxine (SYNTHROID, LEVOTHROID) 50 MCG tablet Take 50 mcg by mouth daily.    . Multiple Vitamin (MULTIVITAMIN) capsule Take 1 capsule by mouth daily.     No current facility-administered medications for this visit.    Facility-Administered Medications Ordered in Other Visits  Medication Dose Route Frequency Provider Last Rate Last Dose  . sodium chloride 0.9 % injection 10 mL  10 mL Intravenous PRN Evlyn Kanner, NP   10 mL at 01/16/15 1527    OBJECTIVE: BP 116/69   Pulse 89   Temp 98.5 F (36.9 C) (Tympanic)   Resp 12   Ht 5' 5.5" (1.664 m)   Wt 178 lb 6.4 oz (80.9 kg)   BMI 29.24 kg/m    Body  mass index is 29.24 kg/m.    ECOG FS:0 - Asymptomatic  General: Well-developed, well-nourished, no acute distress. Eyes: Pink conjunctiva, anicteric sclera. Breast: Bilateral mastectomy without evidence of recurrence. Lungs: Clear to auscultation bilaterally. Heart: Regular rate and rhythm. No rubs, murmurs, or gallops. Abdomen: Soft, nontender, nondistended. No organomegaly noted, normoactive bowel sounds. Musculoskeletal: No edema, cyanosis, or clubbing. Neuro: Alert, answering all questions appropriately. Cranial nerves grossly intact. Skin: No rashes or petechiae noted. Psych: Normal affect. Lymphatics: No cervical, calvicular, axillary or inguinal LAD.   LAB RESULTS:  Lab Results  Component Value Date   LABCA2 25.0 05/18/2014     STUDIES: No results found.  ASSESSMENT:   Carcinoma of right breast, from March 2009, stage IIA, triple negative. 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  PLAN:    1. Bilateral breast carcinomas: Patient  noted to be BRCA positive.  She is now status post bilateral mastectomy as well as prophylactic oophorectomy.  Clinically there is no evidence of recurrent or progressive disease. Although patient was PR weakly positive, her previous oncologist elected not to initiate aromatase inhibitor treatment.  She does not require any further mammograms.  Return to clinic in 1 year for further evaluation. 2. BRCA positive: Patient states she has 2 sons neither of which have been tested.  Have encouraged them to discuss further with her primary care physician and urged genetic testing in the near future.   I spent a total of 15 minutes face-to-face with the patient of which greater than 50% of the visit was spent in counseling and coordination of care as summarized above.   Patient expressed understanding and was in agreement with this plan. She also understands that  She can call clinic at any time with any questions, concerns, or complaints.     Lloyd Huger, MD   12/27/2017 12:10 PM

## 2017-12-24 ENCOUNTER — Encounter: Payer: Self-pay | Admitting: Oncology

## 2017-12-24 ENCOUNTER — Inpatient Hospital Stay: Payer: 59 | Attending: Oncology | Admitting: Oncology

## 2017-12-24 ENCOUNTER — Other Ambulatory Visit: Payer: Self-pay

## 2017-12-24 VITALS — BP 116/69 | HR 89 | Temp 98.5°F | Resp 12 | Ht 65.5 in | Wt 178.4 lb

## 2017-12-24 DIAGNOSIS — Z9013 Acquired absence of bilateral breasts and nipples: Secondary | ICD-10-CM | POA: Insufficient documentation

## 2017-12-24 DIAGNOSIS — Z808 Family history of malignant neoplasm of other organs or systems: Secondary | ICD-10-CM | POA: Insufficient documentation

## 2017-12-24 DIAGNOSIS — E669 Obesity, unspecified: Secondary | ICD-10-CM | POA: Diagnosis not present

## 2017-12-24 DIAGNOSIS — E039 Hypothyroidism, unspecified: Secondary | ICD-10-CM | POA: Diagnosis not present

## 2017-12-24 DIAGNOSIS — Z171 Estrogen receptor negative status [ER-]: Secondary | ICD-10-CM | POA: Insufficient documentation

## 2017-12-24 DIAGNOSIS — Z90710 Acquired absence of both cervix and uterus: Secondary | ICD-10-CM | POA: Diagnosis not present

## 2017-12-24 DIAGNOSIS — K219 Gastro-esophageal reflux disease without esophagitis: Secondary | ICD-10-CM | POA: Diagnosis not present

## 2017-12-24 DIAGNOSIS — Z1509 Genetic susceptibility to other malignant neoplasm: Secondary | ICD-10-CM | POA: Diagnosis not present

## 2017-12-24 DIAGNOSIS — C50412 Malignant neoplasm of upper-outer quadrant of left female breast: Secondary | ICD-10-CM | POA: Diagnosis not present

## 2017-12-24 DIAGNOSIS — Z90722 Acquired absence of ovaries, bilateral: Secondary | ICD-10-CM

## 2017-12-24 DIAGNOSIS — Z79899 Other long term (current) drug therapy: Secondary | ICD-10-CM | POA: Diagnosis not present

## 2017-12-24 NOTE — Progress Notes (Signed)
Patient here for follow up. She has recurrent mouth tenderness.

## 2018-06-17 ENCOUNTER — Ambulatory Visit: Payer: 59 | Admitting: General Surgery

## 2018-12-23 ENCOUNTER — Ambulatory Visit: Payer: 59 | Admitting: Oncology

## 2018-12-23 ENCOUNTER — Other Ambulatory Visit: Payer: 59

## 2019-04-04 ENCOUNTER — Other Ambulatory Visit: Payer: Self-pay

## 2019-04-04 DIAGNOSIS — C50412 Malignant neoplasm of upper-outer quadrant of left female breast: Secondary | ICD-10-CM

## 2019-04-04 NOTE — Progress Notes (Signed)
Called patient no answer, unable to leave message mailbox is full

## 2019-04-05 ENCOUNTER — Encounter: Payer: Self-pay | Admitting: Oncology

## 2019-04-05 ENCOUNTER — Other Ambulatory Visit: Payer: Self-pay

## 2019-04-05 ENCOUNTER — Inpatient Hospital Stay: Payer: Medicare Other | Attending: Oncology

## 2019-04-05 ENCOUNTER — Inpatient Hospital Stay (HOSPITAL_BASED_OUTPATIENT_CLINIC_OR_DEPARTMENT_OTHER): Payer: Medicare Other | Admitting: Oncology

## 2019-04-05 VITALS — BP 130/76 | HR 72 | Temp 98.8°F | Resp 16 | Wt 175.2 lb

## 2019-04-05 DIAGNOSIS — Z9189 Other specified personal risk factors, not elsewhere classified: Secondary | ICD-10-CM | POA: Diagnosis not present

## 2019-04-05 DIAGNOSIS — C50412 Malignant neoplasm of upper-outer quadrant of left female breast: Secondary | ICD-10-CM | POA: Insufficient documentation

## 2019-04-05 DIAGNOSIS — K219 Gastro-esophageal reflux disease without esophagitis: Secondary | ICD-10-CM | POA: Insufficient documentation

## 2019-04-05 DIAGNOSIS — E669 Obesity, unspecified: Secondary | ICD-10-CM | POA: Diagnosis not present

## 2019-04-05 DIAGNOSIS — Z1501 Genetic susceptibility to malignant neoplasm of breast: Secondary | ICD-10-CM

## 2019-04-05 DIAGNOSIS — Z171 Estrogen receptor negative status [ER-]: Secondary | ICD-10-CM

## 2019-04-05 DIAGNOSIS — Z9013 Acquired absence of bilateral breasts and nipples: Secondary | ICD-10-CM | POA: Diagnosis not present

## 2019-04-05 DIAGNOSIS — Z15060 Genetic susceptibility to colorectal cancer: Secondary | ICD-10-CM

## 2019-04-05 DIAGNOSIS — Z853 Personal history of malignant neoplasm of breast: Secondary | ICD-10-CM

## 2019-04-05 DIAGNOSIS — Z1509 Genetic susceptibility to other malignant neoplasm: Secondary | ICD-10-CM

## 2019-04-05 DIAGNOSIS — E039 Hypothyroidism, unspecified: Secondary | ICD-10-CM | POA: Insufficient documentation

## 2019-04-05 DIAGNOSIS — D649 Anemia, unspecified: Secondary | ICD-10-CM | POA: Diagnosis not present

## 2019-04-05 DIAGNOSIS — Z9221 Personal history of antineoplastic chemotherapy: Secondary | ICD-10-CM | POA: Insufficient documentation

## 2019-04-05 DIAGNOSIS — Z79899 Other long term (current) drug therapy: Secondary | ICD-10-CM | POA: Diagnosis not present

## 2019-04-05 LAB — CBC WITH DIFFERENTIAL/PLATELET
Abs Immature Granulocytes: 0.01 10*3/uL (ref 0.00–0.07)
Basophils Absolute: 0 10*3/uL (ref 0.0–0.1)
Basophils Relative: 1 %
Eosinophils Absolute: 0.1 10*3/uL (ref 0.0–0.5)
Eosinophils Relative: 2 %
HCT: 34.5 % — ABNORMAL LOW (ref 36.0–46.0)
Hemoglobin: 11.5 g/dL — ABNORMAL LOW (ref 12.0–15.0)
Immature Granulocytes: 0 %
Lymphocytes Relative: 24 %
Lymphs Abs: 1.3 10*3/uL (ref 0.7–4.0)
MCH: 30 pg (ref 26.0–34.0)
MCHC: 33.3 g/dL (ref 30.0–36.0)
MCV: 90.1 fL (ref 80.0–100.0)
Monocytes Absolute: 0.4 10*3/uL (ref 0.1–1.0)
Monocytes Relative: 8 %
Neutro Abs: 3.5 10*3/uL (ref 1.7–7.7)
Neutrophils Relative %: 65 %
Platelets: 242 10*3/uL (ref 150–400)
RBC: 3.83 MIL/uL — ABNORMAL LOW (ref 3.87–5.11)
RDW: 13.6 % (ref 11.5–15.5)
WBC: 5.4 10*3/uL (ref 4.0–10.5)
nRBC: 0 % (ref 0.0–0.2)

## 2019-04-05 LAB — COMPREHENSIVE METABOLIC PANEL
ALT: 22 U/L (ref 0–44)
AST: 19 U/L (ref 15–41)
Albumin: 4.5 g/dL (ref 3.5–5.0)
Alkaline Phosphatase: 84 U/L (ref 38–126)
Anion gap: 6 (ref 5–15)
BUN: 17 mg/dL (ref 8–23)
CO2: 28 mmol/L (ref 22–32)
Calcium: 10 mg/dL (ref 8.9–10.3)
Chloride: 103 mmol/L (ref 98–111)
Creatinine, Ser: 0.91 mg/dL (ref 0.44–1.00)
GFR calc Af Amer: >60 mL/min (ref >60)
GFR calc non Af Amer: >60 mL/min (ref >60)
Glucose, Bld: 108 mg/dL — ABNORMAL HIGH (ref 70–99)
Potassium: 4.1 mmol/L (ref 3.5–5.1)
Sodium: 137 mmol/L (ref 135–145)
Total Bilirubin: 1.2 mg/dL (ref 0.3–1.2)
Total Protein: 7.6 g/dL (ref 6.5–8.1)

## 2019-04-05 NOTE — Progress Notes (Signed)
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  INTERVAL HISTORY:  Patient was previously treated by Dr. Jeb Levering for T2N0 ER negative, PR weakly positive, HER-2 negative left breast cancer in April 2015. Patient was followed up by Dr. Grayland Ormond recently and the patient prefers to switch care and establish care with me today. Extensive medical records reviewed were performed. #Per note, patient has right breast cancer, status post lumpectomy in 2000.  Triple negative, stage IIa.  Pathology is not in current EMR. #April 2015, pT2 pN0 M0, ER negative PR weakly positive, HER-2 negative left breast cancer # BRCA mutation positive.  Mammoprint high risk 5 years recurrence rate 22 to 28% and 10-year recurrence rate of 22 to 35%. Patient underwent bilateral mastectomy by Dr. Jamal Collin. #Adjuvant chemotherapy in May 2015 with Taxotere and carboplatin. #Prophylactic bilateral vulvectomy in June 2016.  Mediport was removed in Dec.2016. Although PR was weakly positive, decision was made by previous oncologist that aromatase inhibitor was not needed. Patient was last seen by Dr. Grayland Ormond 1 year ago. She denies any fever, chills, nausea, vomiting, decreased appetite, weight loss, new bone pain, shortness of breath, urinary symptoms. She feels that she is at baseline with no specific complaints today.  Review of Systems  Constitutional: Negative for appetite change, chills, fatigue and fever.  HENT:   Negative  for hearing loss and voice change.   Eyes: Negative for eye problems.  Respiratory: Negative for chest tightness and cough.   Cardiovascular: Negative for chest pain.  Gastrointestinal: Negative for abdominal distention, abdominal pain and blood in stool.  Endocrine: Negative for hot flashes.  Genitourinary: Negative for difficulty urinating and frequency.   Musculoskeletal: Negative for arthralgias.  Skin: Negative for itching and rash.  Neurological: Negative for extremity weakness.  Hematological: Negative for adenopathy.  Psychiatric/Behavioral: Negative for confusion.       PAST MEDICAL HISTORY: Past Medical History:  Diagnosis Date  . Acid reflux 2006  . Anemia    history  . Bladder infection   . Breast screening, unspecified   . Cancer ([place]) 1997   cervical  . 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 Endocrine problem 2009   thyroid  . Hypothyroidism   . Obesity, unspecified   . Personal history of malignant neoplasm of breast 2009, 2015   right breast  . Special screening for malignant neoplasms, colon     PAST SURGICAL HISTORY: Past Surgical History:  Procedure Laterality Date  . ABDOMINAL HYSTERECTOMY  1997  . APPENDECTOMY    . BREAST SURGERY Bilateral 10/06/13   mastectomy, BRCA 1 carrier  . CESAREAN SECTION  1983  . COLONOSCOPY  2011   Dr. Vira Agar  . LAPAROSCOPIC BILATERAL SALPINGO OOPHERECTOMY Bilateral 12/20/2014   Procedure: LAPAROSCOPIC BILATERAL SALPINGO OOPHORECTOMY;  Surgeon: [name], MD;  Location: [facility] ORS;  Service: Gynecology;  Laterality: Bilateral;  . LAPAROTOMY N/A 12/20/2014   Procedure: LAPAROTOMY;  Surgeon: [name], MD;  Location: [facility] ORS;  Service: Gynecology;  Laterality: N/A;  . PORT-A-CATH REMOVAL  2012  . PORTACATH PLACEMENT  2009,11/15/13  . UPPER GI ENDOSCOPY  2011   Dr. Vira Agar    FAMILY HISTORY Family History  Problem Relation Age of Onset  . Cancer Paternal Grandfather        cancer of the heel     GYNECOLOGIC HISTORY:  No LMP recorded. Patient has had a hysterectomy.   Social History Social History   Tobacco Use  . Smoking status: Never Smoker  . Smokeless tobacco: Never Used  Substance Use Topics  . Alcohol use: No  . Drug use: No    Allergies  Allergen Reactions  . Omeprazole   . Tape     Redness   . Sulfa Antibiotics Rash    Current Outpatient Medications  Medication Sig Dispense Refill  . atorvastatin (LIPITOR) 10 MG tablet Take 10 mg by mouth daily.    . Cholecalciferol (VITAMIN D3) 1000 UNITS CAPS Take by mouth daily.    . citalopram (CELEXA) 10 MG tablet Take by mouth.    . dicyclomine (BENTYL) 20 MG tablet take 1 tablet by mouth twice a day    . esomeprazole (NEXIUM) 40 MG capsule Take 40 mg by mouth as needed.     . fluticasone (FLONASE) 50 MCG/ACT nasal spray Place into the nose.    . levothyroxine (SYNTHROID, LEVOTHROID) 50 MCG tablet Take 50 mcg by mouth daily.    . Multiple Vitamin (MULTIVITAMIN) capsule Take 1 capsule by mouth daily.    [name] oxyCODONE-acetaminophen (PERCOCET/ROXICET) 5-325 MG tablet oxycodone-acetaminophen 5 mg-325 mg tablet     No current facility-administered medications for this visit.    Facility-Administered Medications Ordered in Other Visits  Medication Dose Route Frequency Provider Last Rate Last Dose  . sodium chloride 0.9 % injection 10 mL  10 mL Intravenous PRN Evlyn Kanner, NP   10 mL at 01/16/15 1527    OBJECTIVE: BP 130/76   Pulse 72   Temp 98.8 F (37.1 C)   Resp 16   Wt 175 lb 3.2 oz (79.5 kg)   BMI 28.71 kg/m    Body mass index is 28.71 kg/m.    ECOG FS:0 - Asymptomatic Physical Exam  Constitutional: She is oriented to person, place, and time. No distress.  HENT:  Head: Normocephalic and atraumatic.  Nose: Nose normal.  Mouth/Throat: Oropharynx is clear and moist. No oropharyngeal exudate.  Eyes: Pupils are equal, round, and reactive to light. EOM are normal. No scleral icterus.  Neck: Normal range of  motion. Neck supple.  Cardiovascular: Normal rate and regular rhythm.  No murmur heard. Pulmonary/Chest: Effort normal. No respiratory distress. She has no rales. She exhibits no tenderness.  Abdominal: Soft. She exhibits no distension. There is no abdominal tenderness.  Musculoskeletal: Normal range of motion.        General: No edema.  Neurological: She is alert and oriented to person, place, and time. No cranial nerve deficit. She exhibits normal muscle tone. Coordination normal.  Skin: Skin is warm and dry. She is not diaphoretic. No erythema.  Psychiatric: Affect normal.   Breast exam is performed in seated and lying down position. Patient is status post bilateral mastectomy. 1 cm nodule palpated at left breast mastectomy surgical site.  Patient is not sure if this is chronic or new.  She never noticed that.   LAB RESULTS: CBC    Component Value Date/Time   WBC 5.4 04/05/2019 1012   RBC 3.83 (L) 04/05/2019 1012   HGB 11.5 (L) 04/05/2019 1012   HGB 11.2 (L) 05/18/2014 1045  HCT 34.5 (L) 04/05/2019 1012   HCT 34.7 (L) 05/18/2014 1045   PLT 242 04/05/2019 1012   PLT 218 05/18/2014 1045   MCV 90.1 04/05/2019 1012   MCV 92 05/18/2014 1045   MCH 30.0 04/05/2019 1012   MCHC 33.3 04/05/2019 1012   RDW 13.6 04/05/2019 1012   RDW 12.9 05/18/2014 1045   LYMPHSABS 1.3 04/05/2019 1012   LYMPHSABS 1.5 05/18/2014 1045   MONOABS 0.4 04/05/2019 1012   MONOABS 0.3 05/18/2014 1045   EOSABS 0.1 04/05/2019 1012   EOSABS 0.1 05/18/2014 1045   BASOSABS 0.0 04/05/2019 1012   BASOSABS 0.0 05/18/2014 1045   CMP Latest Ref Rng & Units 04/05/2019 12/11/2014 05/18/2014  Glucose 70 - 99 mg/dL 108(H) 99 97  BUN 8 - 23 mg/dL '17 20 16  ' Creatinine 0.44 - 1.00 mg/dL 0.91 0.85 0.90  Sodium 135 - 145 mmol/L 137 139 142  Potassium 3.5 - 5.1 mmol/L 4.1 4.1 4.2  Chloride 98 - 111 mmol/L 103 109 106  CO2 22 - 32 mmol/L '28 27 28  ' Calcium 8.9 - 10.3 mg/dL 10.0 9.3 9.4  Total Protein 6.5 - 8.1 g/dL 7.6  7.2 7.1  Total Bilirubin 0.3 - 1.2 mg/dL 1.2 0.9 0.5  Alkaline Phos 38 - 126 U/L 84 84 95  AST 15 - 41 U/L 19 19 13(L)  ALT 0 - 44 U/L '22 15 24     ' STUDIES: No results found.  Assessment and plan 1. History of breast cancer in female   2. BRCA gene mutation positive   3. At risk for osteoporosis   4. Normocytic anemia    History of right breast stage II triple negative breast cancer. History of left breast T2 N0 M0 ER negative, PR weakly positive and HER-2 negative cancer. BRCA 1 positive per Dr. Angie Fava note.-Report was not available to me. Previously underwent adjuvant chemotherapy. Labs are reviewed and discussed with patient.  Stable counts.  Mild anemia which is chronic. Physical examination showed a very small, 1cm left mastectomy site nodule, unclear chronicity.  Possible scarring tissue.  I would refer patient to establish care with Dr. Bary Castilla for further evaluation of the chest wall nodule.  #BRCA positivity Status post bilateral lobectomy and bilateral mastectomy. Advised patient that her family members need to be tested.  Encourage patient to discuss with family members and urged the next testing in the near future. Discussed with patient that she has increased cancer risk in pancreatic cancer, colorectal cancer, melanoma, primary peritoneal cancer as well.  Recommend patient to discuss with gastroenterology for appropriate colonoscopy screening. Recommend full skin examination annually There are no consensus regarding screening for melanoma or pancreatic cancer.  #Anemia, normocytic.  Chronic.  Hemoglobin has been stable at 11.5 today.  Continue to monitor.  Add reticulocyte panel today. #History of bilateral oophectomy, at risk of osteoporosis.   Recommend patient to obtain bone density scan every 2 years.  She has not had any recently.  Encourage patient to discuss with primary care physician.  Recommend patient to take calcium and vitamin D supplementation.   Patient expressed understanding and was in agreement with this plan. She also understands that She can call clinic at any time with any questions, concerns, or complaints.  We spent sufficient time to discuss many aspect of care, questions were answered to patient's satisfaction. Total face to face encounter time for this patient visit was 40 min. >50% of the time was  spent in counseling and coordination of care.  Earlie Server, MD   04/05/2019 8:11 PM

## 2019-04-05 NOTE — Progress Notes (Signed)
Patient does not offer any problems today.  

## 2019-04-06 ENCOUNTER — Other Ambulatory Visit: Payer: Self-pay

## 2019-04-06 DIAGNOSIS — D649 Anemia, unspecified: Secondary | ICD-10-CM

## 2019-04-06 LAB — RETIC PANEL
Immature Retic Fract: 9 % (ref 2.3–15.9)
RBC.: 3.9 MIL/uL (ref 3.87–5.11)
Retic Count, Absolute: 59.3 10*3/uL (ref 19.0–186.0)
Retic Ct Pct: 1.5 % (ref 0.4–3.1)
Reticulocyte Hemoglobin: 34.4 pg (ref >27.9)

## 2019-04-06 LAB — CANCER ANTIGEN 27.29: CA 27.29: 32.3 U/mL (ref 0.0–38.6)

## 2020-03-26 ENCOUNTER — Inpatient Hospital Stay (HOSPITAL_BASED_OUTPATIENT_CLINIC_OR_DEPARTMENT_OTHER): Payer: Medicare Other | Admitting: Oncology

## 2020-03-26 ENCOUNTER — Other Ambulatory Visit: Payer: Self-pay

## 2020-03-26 ENCOUNTER — Inpatient Hospital Stay: Payer: Medicare Other | Attending: Oncology

## 2020-03-26 ENCOUNTER — Encounter: Payer: Self-pay | Admitting: Oncology

## 2020-03-26 VITALS — BP 140/77 | HR 78 | Temp 98.3°F | Resp 18 | Wt 179.9 lb

## 2020-03-26 DIAGNOSIS — Z9013 Acquired absence of bilateral breasts and nipples: Secondary | ICD-10-CM | POA: Insufficient documentation

## 2020-03-26 DIAGNOSIS — Z853 Personal history of malignant neoplasm of breast: Secondary | ICD-10-CM | POA: Insufficient documentation

## 2020-03-26 DIAGNOSIS — Z1501 Genetic susceptibility to malignant neoplasm of breast: Secondary | ICD-10-CM

## 2020-03-26 DIAGNOSIS — Z9221 Personal history of antineoplastic chemotherapy: Secondary | ICD-10-CM | POA: Diagnosis not present

## 2020-03-26 DIAGNOSIS — Z9189 Other specified personal risk factors, not elsewhere classified: Secondary | ICD-10-CM | POA: Diagnosis not present

## 2020-03-26 DIAGNOSIS — D649 Anemia, unspecified: Secondary | ICD-10-CM | POA: Diagnosis not present

## 2020-03-26 DIAGNOSIS — Z1509 Genetic susceptibility to other malignant neoplasm: Secondary | ICD-10-CM

## 2020-03-26 DIAGNOSIS — Z15060 Genetic susceptibility to colorectal cancer: Secondary | ICD-10-CM

## 2020-03-26 LAB — COMPREHENSIVE METABOLIC PANEL
ALT: 18 U/L (ref 0–44)
AST: 19 U/L (ref 15–41)
Albumin: 4.5 g/dL (ref 3.5–5.0)
Alkaline Phosphatase: 90 U/L (ref 38–126)
Anion gap: 7 (ref 5–15)
BUN: 21 mg/dL (ref 8–23)
CO2: 27 mmol/L (ref 22–32)
Calcium: 9.2 mg/dL (ref 8.9–10.3)
Chloride: 103 mmol/L (ref 98–111)
Creatinine, Ser: 1 mg/dL (ref 0.44–1.00)
GFR calc Af Amer: >60 mL/min (ref >60)
GFR calc non Af Amer: 58 mL/min — ABNORMAL LOW (ref >60)
Glucose, Bld: 116 mg/dL — ABNORMAL HIGH (ref 70–99)
Potassium: 4.3 mmol/L (ref 3.5–5.1)
Sodium: 137 mmol/L (ref 135–145)
Total Bilirubin: 1.4 mg/dL — ABNORMAL HIGH (ref 0.3–1.2)
Total Protein: 7.8 g/dL (ref 6.5–8.1)

## 2020-03-26 LAB — CBC WITH DIFFERENTIAL/PLATELET
Abs Immature Granulocytes: 0.01 10*3/uL (ref 0.00–0.07)
Basophils Absolute: 0 10*3/uL (ref 0.0–0.1)
Basophils Relative: 1 %
Eosinophils Absolute: 0.3 10*3/uL (ref 0.0–0.5)
Eosinophils Relative: 4 %
HCT: 33.9 % — ABNORMAL LOW (ref 36.0–46.0)
Hemoglobin: 11.4 g/dL — ABNORMAL LOW (ref 12.0–15.0)
Immature Granulocytes: 0 %
Lymphocytes Relative: 23 %
Lymphs Abs: 1.5 10*3/uL (ref 0.7–4.0)
MCH: 30 pg (ref 26.0–34.0)
MCHC: 33.6 g/dL (ref 30.0–36.0)
MCV: 89.2 fL (ref 80.0–100.0)
Monocytes Absolute: 0.5 10*3/uL (ref 0.1–1.0)
Monocytes Relative: 8 %
Neutro Abs: 4.2 10*3/uL (ref 1.7–7.7)
Neutrophils Relative %: 64 %
Platelets: 240 10*3/uL (ref 150–400)
RBC: 3.8 MIL/uL — ABNORMAL LOW (ref 3.87–5.11)
RDW: 13.4 % (ref 11.5–15.5)
WBC: 6.5 10*3/uL (ref 4.0–10.5)
nRBC: 0 % (ref 0.0–0.2)

## 2020-03-26 LAB — BILIRUBIN, DIRECT: Bilirubin, Direct: 0.2 mg/dL (ref 0.0–0.2)

## 2020-03-26 NOTE — Progress Notes (Signed)
Patient denies new problems/concerns today.   °

## 2020-03-26 NOTE — Progress Notes (Signed)
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  Pertinent oncology history Patient was previously treated by Dr. Jeb Levering for T2N0 ER negative, PR weakly positive, HER-2 negative left breast cancer in April 2015. Patient was followed up by Dr. Grayland Ormond recently and the patient prefers to switch care and establish care with me on 04/05/2019.  Extensive medical records reviewed were performed. #Per note, patient has right breast cancer, status post lumpectomy in 2000.  Triple negative, stage IIa.  Pathology is not in current EMR. #April 2015, pT2 pN0 M0, ER negative PR weakly positive, HER-2 negative left breast cancer # BRCA mutation positive.  Mammoprint high risk 5 years recurrence rate 22 to 28% and 10-year recurrence rate of 22 to 35%. Patient underwent bilateral mastectomy by Dr. Jamal Collin. #Adjuvant chemotherapy in May 2015 with Taxotere and carboplatin. #Prophylactic bilateral vulvectomy in June 2016.  Mediport was removed in Dec.2016. Although PR was weakly positive, decision was made by previous oncologist that aromatase inhibitor was not needed.     INTERVAL HISTORY Morgan Flores is a 69 y.o. female who has above history reviewed by me today presents for follow up visit for management of history of breast cancer and BRCA 1 mutation Problems and complaints are listed below: Patient reports doing well.  She was seen by Dr. Bary Castilla after her last appointment with me regarding to the 1cm  left mastectomy site nodule at that time.  Per patient, Dr. Bary Castilla told her that findings were normal. Patient reports doing well and has no new complaints. She denies any fever, chills, nausea, vomiting, decreased appetite, weight loss, new bone pain, shortness of breath, urinary symptoms. Review of Systems  Constitutional: Negative for appetite change, chills, fatigue and fever.  HENT:   Negative for hearing loss and voice change.   Eyes: Negative for eye problems.  Respiratory: Negative for chest tightness and cough.   Cardiovascular: Negative for chest pain.  Gastrointestinal: Negative for abdominal distention, abdominal pain and blood in stool.  Endocrine: Negative for hot flashes.  Genitourinary: Negative for difficulty urinating and frequency.   Musculoskeletal: Negative for arthralgias.  Skin: Negative for itching and rash.  Neurological: Negative for extremity weakness.  Hematological: Negative for adenopathy.  Psychiatric/Behavioral: Negative for confusion.       PAST MEDICAL HISTORY: Past Medical History:  Diagnosis Date  . Acid reflux 2006  . Anemia    history  . Bladder infection   . Breast screening, unspecified   . Cancer ([place]) 1997   cervical  . Drug related polyneuropathy ([place]) 01/18/2014  . Endocrine problem 2009   thyroid  . Hypothyroidism   . Obesity, unspecified   . Personal history of malignant neoplasm of breast 2009, 2015   right breast  . Special screening for malignant neoplasms, colon     PAST SURGICAL HISTORY: Past Surgical History:  Procedure Laterality Date  . ABDOMINAL HYSTERECTOMY  1997  . APPENDECTOMY    . BREAST SURGERY Bilateral 10/06/13   mastectomy, BRCA 1 carrier  . CESAREAN SECTION  1983  . COLONOSCOPY  2011   Dr. Vira Agar  . LAPAROSCOPIC BILATERAL SALPINGO OOPHERECTOMY Bilateral 12/20/2014   Procedure: LAPAROSCOPIC BILATERAL SALPINGO OOPHORECTOMY;  Surgeon: [name], MD;  Location: [facility] ORS;  Service: Gynecology;   Laterality: Bilateral;  . LAPAROTOMY N/A 12/20/2014   Procedure: LAPAROTOMY;  Surgeon: [name], MD;  Location: [facility] ORS;  Service: Gynecology;  Laterality: N/A;  . PORT-A-CATH REMOVAL  2012  . PORTACATH PLACEMENT  2009,11/15/13  . UPPER GI ENDOSCOPY  2011   Dr. Vira Agar    FAMILY HISTORY Family History  Problem Relation Age of Onset  . Cancer Paternal Grandfather        cancer of the heel    GYNECOLOGIC HISTORY:  No LMP recorded. Patient has had a hysterectomy.   Social History Social History   Tobacco Use  . Smoking status: Never Smoker  . Smokeless tobacco: Never Used  Substance Use Topics  . Alcohol use: No  . Drug use: No    Allergies  Allergen Reactions  . Omeprazole   . Tape     Redness   . Sulfa Antibiotics Rash    Current Outpatient Medications  Medication Sig Dispense Refill  . atorvastatin (LIPITOR) 10 MG tablet Take 10 mg by mouth daily.    . Cholecalciferol (VITAMIN D3) 1000 UNITS CAPS Take by mouth daily.    . citalopram (CELEXA) 10 MG tablet Take by mouth.    . dicyclomine (BENTYL) 20 MG tablet take 1 tablet by mouth twice a day    . esomeprazole (NEXIUM) 40 MG capsule Take 40 mg by mouth as needed.     . fluticasone (FLONASE) 50 MCG/ACT nasal spray Place into the nose.    . levothyroxine (SYNTHROID, LEVOTHROID) 50 MCG tablet Take 50 mcg by mouth daily.    . Multiple Vitamin (MULTIVITAMIN) capsule Take 1 capsule by mouth daily.    [name] oxyCODONE-acetaminophen (PERCOCET/ROXICET) 5-325 MG tablet oxycodone-acetaminophen 5 mg-325 mg tablet (Patient not taking: Reported on 03/26/2020)     No current facility-administered medications for this visit.   Facility-Administered Medications Ordered in Other Visits  Medication Dose Route Frequency Provider Last Rate Last Admin  . sodium chloride 0.9 % injection 10 mL  10 mL Intravenous PRN Herring, Orville Govern, NP   10 mL at 01/16/15 1527    OBJECTIVE: BP 140/77   Pulse 78   Temp 98.3 F (36.8 C)   Resp 18    Wt 179 lb 14.4 oz (81.6 kg)   BMI 29.48 kg/m    Body mass index is 29.48 kg/m.    ECOG FS:0 - Asymptomatic Physical Exam Constitutional:      General: She is not in acute distress.    Appearance: She is not diaphoretic.  HENT:     Head: Normocephalic and atraumatic.     Nose: Nose normal.     Mouth/Throat:     Pharynx: No oropharyngeal exudate.  Eyes:     General: No scleral icterus.    Pupils: Pupils are equal, round, and reactive to light.  Cardiovascular:     Rate and Rhythm: Normal rate and regular rhythm.     Heart sounds: No murmur heard.   Pulmonary:     Effort: Pulmonary effort is normal. No respiratory distress.     Breath sounds: No rales.  Chest:     Chest wall: No tenderness.  Abdominal:     General: There is no distension.     Palpations: Abdomen is soft.     Tenderness: There is no abdominal  tenderness.  Musculoskeletal:        General: Normal range of motion.     Cervical back: Normal range of motion and neck supple.  Skin:    General: Skin is warm and dry.     Findings: No erythema.  Neurological:     Mental Status: She is alert and oriented to person, place, and time.     Cranial Nerves: No cranial nerve deficit.     Motor: No abnormal muscle tone.     Coordination: Coordination normal.  Psychiatric:        Mood and Affect: Affect normal.    Breast exam is performed in seated and lying down position. Patient is status post bilateral mastectomy.   No palpable mass on chest wall at bilateral mastectomy sites.  No palpable axillary lymphadenopathy.   LAB RESULTS: CBC    Component Value Date/Time   WBC 6.5 03/26/2020 0917   RBC 3.80 (L) 03/26/2020 0917   HGB 11.4 (L) 03/26/2020 0917   HGB 11.2 (L) 05/18/2014 1045   HCT 33.9 (L) 03/26/2020 0917   HCT 34.7 (L) 05/18/2014 1045   PLT 240 03/26/2020 0917   PLT 218 05/18/2014 1045   MCV 89.2 03/26/2020 0917   MCV 92 05/18/2014 1045   MCH 30.0 03/26/2020 0917   MCHC 33.6 03/26/2020 0917   RDW  13.4 03/26/2020 0917   RDW 12.9 05/18/2014 1045   LYMPHSABS 1.5 03/26/2020 0917   LYMPHSABS 1.5 05/18/2014 1045   MONOABS 0.5 03/26/2020 0917   MONOABS 0.3 05/18/2014 1045   EOSABS 0.3 03/26/2020 0917   EOSABS 0.1 05/18/2014 1045   BASOSABS 0.0 03/26/2020 0917   BASOSABS 0.0 05/18/2014 1045   CMP Latest Ref Rng & Units 03/26/2020 04/05/2019 12/11/2014  Glucose 70 - 99 mg/dL 116(H) 108(H) 99  BUN 8 - 23 mg/dL _0 Creatinine 0.44 - 1.00 mg/dL 1.00 0.91 0.85  Sodium 135 - 145 mmol/L 137 137 139  Potassium 3.5 - 5.1 mmol/L 4.3 4.1 4.1  Chloride 98 - 111 mmol/L 103 103 109  CO2 22 - 32 mmol/L _1 Calcium 8.9 - 10.3 mg/dL 9.2 10.0 9.3  Total Protein 6.5 - 8.1 g/dL 7.8 7.6 7.2  Total Bilirubin 0.3 - 1.2 mg/dL 1.4(H) 1.2 0.9  Alkaline Phos 38 - 126 U/L 90 84 84  AST 15 - 41 U/L _2 ALT 0 - 44 U/L _3 STUDIES: No results found.  Assessment and plan 1. History of breast cancer in female   2. BRCA gene mutation positive   3. At risk for osteoporosis   4. Normocytic anemia    History of right breast stage II triple negative breast cancer. History of left breast T2 N0 M0 ER negative, PR weakly positive and HER-2 negative cancer. BRCA 1 positive per Dr. Angie Fava note.-Report was not available to me. Previously underwent adjuvant chemotherapy.   Labs reviewed and discussed with patient. Her counts are stable. She seems to also doing very well clinically. Continue annual visits.  #BRCA1 positivity per Dr. Rayfield Citizen note. Patient has underwent bilateral mastectomy and prophylactic bilateral oophorectomy Advised that first-degree family members to be tested. Discussed with the patient that she has increased risks in pancreatic cancer, colorectal cancer, melanoma, primary peritoneal cancers as well. I recommend patient to stay up-to-date for her age-appropriate screening including colonoscopy. Recommend full skin examination annually. No history of pancreatic  cancer, she does not meet criteria for pancreatic  cancer screening.   #Anemia, normocytic.  Chronic.  Stable hemoglobin.   #History of bilateral oophectomy, at risk of osteoporosis.  Recommend patient to take calcium and vitamin D supplementation.  Recommend bone density to be done every 2 years with primary care provider. Follow-up in 1 year Patient expressed understanding and was in agreement with this plan. She also understands that She can call clinic at any time with any questions, concerns, or complaints.  We spent sufficient time to discuss many aspect of care, questions were answered to patient's satisfaction.  Earlie Server, MD   03/26/2020 1:07 PM

## 2021-03-26 ENCOUNTER — Other Ambulatory Visit: Payer: Medicare Other

## 2021-03-26 ENCOUNTER — Ambulatory Visit: Payer: Medicare Other | Admitting: Oncology

## 2021-05-30 ENCOUNTER — Other Ambulatory Visit: Payer: Self-pay | Admitting: *Deleted

## 2021-05-30 DIAGNOSIS — Z853 Personal history of malignant neoplasm of breast: Secondary | ICD-10-CM

## 2021-06-05 ENCOUNTER — Encounter: Payer: Self-pay | Admitting: Oncology

## 2021-06-05 ENCOUNTER — Other Ambulatory Visit: Payer: Self-pay

## 2021-06-05 ENCOUNTER — Inpatient Hospital Stay: Payer: Medicare Other

## 2021-06-05 ENCOUNTER — Inpatient Hospital Stay: Payer: Medicare Other | Attending: Oncology | Admitting: Oncology

## 2021-06-05 VITALS — BP 146/79 | HR 85 | Temp 97.6°F | Resp 18 | Wt 179.5 lb

## 2021-06-05 DIAGNOSIS — Z79899 Other long term (current) drug therapy: Secondary | ICD-10-CM | POA: Insufficient documentation

## 2021-06-05 DIAGNOSIS — Z1509 Genetic susceptibility to other malignant neoplasm: Secondary | ICD-10-CM

## 2021-06-05 DIAGNOSIS — E669 Obesity, unspecified: Secondary | ICD-10-CM | POA: Insufficient documentation

## 2021-06-05 DIAGNOSIS — Z171 Estrogen receptor negative status [ER-]: Secondary | ICD-10-CM | POA: Diagnosis not present

## 2021-06-05 DIAGNOSIS — E039 Hypothyroidism, unspecified: Secondary | ICD-10-CM | POA: Diagnosis not present

## 2021-06-05 DIAGNOSIS — Z1501 Genetic susceptibility to malignant neoplasm of breast: Secondary | ICD-10-CM | POA: Insufficient documentation

## 2021-06-05 DIAGNOSIS — K219 Gastro-esophageal reflux disease without esophagitis: Secondary | ICD-10-CM | POA: Diagnosis not present

## 2021-06-05 DIAGNOSIS — C50411 Malignant neoplasm of upper-outer quadrant of right female breast: Secondary | ICD-10-CM | POA: Diagnosis not present

## 2021-06-05 DIAGNOSIS — Z853 Personal history of malignant neoplasm of breast: Secondary | ICD-10-CM | POA: Diagnosis not present

## 2021-06-05 DIAGNOSIS — D649 Anemia, unspecified: Secondary | ICD-10-CM

## 2021-06-05 DIAGNOSIS — Z808 Family history of malignant neoplasm of other organs or systems: Secondary | ICD-10-CM | POA: Insufficient documentation

## 2021-06-05 DIAGNOSIS — Z9013 Acquired absence of bilateral breasts and nipples: Secondary | ICD-10-CM | POA: Insufficient documentation

## 2021-06-05 DIAGNOSIS — Z9189 Other specified personal risk factors, not elsewhere classified: Secondary | ICD-10-CM | POA: Diagnosis not present

## 2021-06-05 DIAGNOSIS — Z15060 Genetic susceptibility to colorectal cancer: Secondary | ICD-10-CM

## 2021-06-05 LAB — COMPREHENSIVE METABOLIC PANEL
ALT: 19 U/L (ref 0–44)
AST: 18 U/L (ref 15–41)
Albumin: 4.4 g/dL (ref 3.5–5.0)
Alkaline Phosphatase: 84 U/L (ref 38–126)
Anion gap: 12 (ref 5–15)
BUN: 17 mg/dL (ref 8–23)
CO2: 25 mmol/L (ref 22–32)
Calcium: 9.6 mg/dL (ref 8.9–10.3)
Chloride: 101 mmol/L (ref 98–111)
Creatinine, Ser: 0.96 mg/dL (ref 0.44–1.00)
GFR, Estimated: >60 mL/min (ref >60)
Glucose, Bld: 103 mg/dL — ABNORMAL HIGH (ref 70–99)
Potassium: 3.8 mmol/L (ref 3.5–5.1)
Sodium: 138 mmol/L (ref 135–145)
Total Bilirubin: 1.2 mg/dL (ref 0.3–1.2)
Total Protein: 7.7 g/dL (ref 6.5–8.1)

## 2021-06-05 LAB — CBC WITH DIFFERENTIAL/PLATELET
Abs Immature Granulocytes: 0.02 10*3/uL (ref 0.00–0.07)
Basophils Absolute: 0.1 10*3/uL (ref 0.0–0.1)
Basophils Relative: 1 %
Eosinophils Absolute: 0.2 10*3/uL (ref 0.0–0.5)
Eosinophils Relative: 3 %
HCT: 34.8 % — ABNORMAL LOW (ref 36.0–46.0)
Hemoglobin: 11.5 g/dL — ABNORMAL LOW (ref 12.0–15.0)
Immature Granulocytes: 0 %
Lymphocytes Relative: 24 %
Lymphs Abs: 1.6 10*3/uL (ref 0.7–4.0)
MCH: 30.2 pg (ref 26.0–34.0)
MCHC: 33 g/dL (ref 30.0–36.0)
MCV: 91.3 fL (ref 80.0–100.0)
Monocytes Absolute: 0.5 10*3/uL (ref 0.1–1.0)
Monocytes Relative: 8 %
Neutro Abs: 4 10*3/uL (ref 1.7–7.7)
Neutrophils Relative %: 64 %
Platelets: 285 10*3/uL (ref 150–400)
RBC: 3.81 MIL/uL — ABNORMAL LOW (ref 3.87–5.11)
RDW: 14.5 % (ref 11.5–15.5)
WBC: 6.4 10*3/uL (ref 4.0–10.5)
nRBC: 0 % (ref 0.0–0.2)

## 2021-06-05 NOTE — Progress Notes (Signed)
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  Pertinent oncology history Patient was previously treated by Dr. Jeb Levering for T2N0 ER negative, PR weakly positive, HER-2 negative left breast cancer in April 2015. Patient was followed up by Dr. Grayland Ormond recently and the patient prefers to switch care and establish care with me on 04/05/2019.  Extensive medical records reviewed were performed. #Per note, patient has right breast cancer, status post lumpectomy in 2000.  Triple negative, stage IIa.  Pathology is not in current EMR. #April 2015, pT2 pN0 M0, ER negative PR weakly positive, HER-2 negative left breast cancer # BRCA mutation positive.  Mammoprint high risk 5 years recurrence rate 22 to 28% and 10-year recurrence rate of 22 to 35%. Patient underwent bilateral mastectomy by Dr. Jamal Collin. #Adjuvant chemotherapy in May 2015 with Taxotere and carboplatin. #Prophylactic bilateral vulvectomy in June 2016.  Mediport was removed in Dec.2016. Although PR was weakly positive, decision was made by previous oncologist that aromatase inhibitor was not needed.  She was seen by Dr. Bary Castilla after her last appointment with me regarding to the 1cm left mastectomy site nodule at that time.  Per patient, Dr. Bary Castilla told her that findings were normal.   INTERVAL HISTORY Morgan Flores is a 70 y.o. female who has above history reviewed by me today presents for follow up visit for management of history of  breast cancer and BRCA 1 mutation  Patient reports doing well and has no new complaints. She denies any fever, chills, nausea, vomiting, decreased appetite, weight loss, new bone pain, shortness of breath, urinary symptoms.   Review of Systems  Constitutional:  Negative for appetite change, chills, fatigue and fever.  HENT:   Negative for hearing loss and voice change.   Eyes:  Negative for eye problems.  Respiratory:  Negative for chest tightness and cough.   Cardiovascular:  Negative for chest pain.  Gastrointestinal:  Negative for abdominal distention, abdominal pain and blood in stool.  Endocrine: Negative for hot flashes.  Genitourinary:  Negative for difficulty urinating and frequency.   Musculoskeletal:  Negative for arthralgias.  Skin:  Negative for itching and rash.  Neurological:  Negative for extremity weakness.  Hematological:  Negative for adenopathy.  Psychiatric/Behavioral:  Negative for confusion.       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 Mechele Collin   LAPAROSCOPIC BILATERAL SALPINGO OOPHERECTOMY Bilateral 12/20/2014   Procedure: LAPAROSCOPIC BILATERAL SALPINGO OOPHORECTOMY;  Surgeon: Leida Lauth, MD;  Location: [facility] ORS;  Service: Gynecology;  Laterality: Bilateral;   LAPAROTOMY N/A 12/20/2014   Procedure: LAPAROTOMY;   Surgeon: Leida Lauth, MD;  Location: [facility] ORS;  Service: Gynecology;  Laterality: N/A;   PORT-A-CATH REMOVAL  2012   PORTACATH PLACEMENT  2009,11/15/13   UPPER GI ENDOSCOPY  2011   Dr. Mechele Collin    FAMILY HISTORY Family History  Problem Relation Age of Onset   Cancer Paternal Grandfather        cancer of the heel    GYNECOLOGIC HISTORY:  No LMP recorded. Patient has had a hysterectomy.   Social History Social History   Tobacco Use   Smoking status: Never   Smokeless tobacco: Never  Substance Use Topics   Alcohol use: No   Drug use: No    Allergies  Allergen Reactions   Omeprazole    Tape     Redness    Sulfa Antibiotics Rash    Current Outpatient Medications  Medication Sig Dispense Refill   atorvastatin (LIPITOR) 10 MG tablet Take 10 mg by mouth daily.     Cholecalciferol (VITAMIN D3) 1000 UNITS CAPS Take by mouth daily.     citalopram (CELEXA) 10 MG tablet Take by mouth.     dicyclomine (BENTYL) 20 MG tablet take 1 tablet by mouth twice a day     esomeprazole (NEXIUM) 40 MG capsule Take 40 mg by mouth as needed.      fluticasone (FLONASE) 50 MCG/ACT nasal spray Place into the nose.     levothyroxine (SYNTHROID, LEVOTHROID) 50 MCG tablet Take 50 mcg by mouth daily.     Multiple Vitamin (MULTIVITAMIN) capsule Take 1 capsule by mouth daily.     oxyCODONE-acetaminophen (PERCOCET/ROXICET) 5-325 MG tablet oxycodone-acetaminophen 5 mg-325 mg tablet (Patient not taking: No sig reported)     No current facility-administered medications for this visit.   Facility-Administered Medications Ordered in Other Visits  Medication Dose Route Frequency Provider Last Rate Last Admin   sodium chloride 0.9 % injection 10 mL  10 mL Intravenous PRN Herring, Vear Clock, NP   10 mL at 01/16/15 1527    OBJECTIVE: BP (!) 146/79 (BP Location: Left Arm, Patient Position: Sitting)   Pulse 85   Temp 97.6 F (36.4 C)   Resp 18   Wt 179 lb 8 oz (81.4 kg)   BMI 29.42 kg/m    Body mass  index is 29.42 kg/m.     ECOG FS:0 - Asymptomatic Physical Exam Constitutional:      General: She is not in acute distress.    Appearance: She is not diaphoretic.  HENT:     Head: Normocephalic and atraumatic.     Nose: Nose normal.     Mouth/Throat:     Pharynx: No oropharyngeal exudate.  Eyes:     General: No scleral icterus.    Pupils: Pupils are equal, round, and reactive to light.  Cardiovascular:     Rate and Rhythm: Normal rate and regular rhythm.     Heart sounds: No murmur heard. Pulmonary:     Effort: Pulmonary effort is normal. No respiratory distress.     Breath sounds: No rales.  Chest:     Chest wall: No tenderness.  Abdominal:     General: There is no distension.     Palpations: Abdomen is soft.  Tenderness: There is no abdominal tenderness.  Musculoskeletal:        General: Normal range of motion.     Cervical back: Normal range of motion and neck supple.  Skin:    General: Skin is warm and dry.     Findings: No erythema.  Neurological:     Mental Status: She is alert and oriented to person, place, and time.     Cranial Nerves: No cranial nerve deficit.     Motor: No abnormal muscle tone.     Coordination: Coordination normal.  Psychiatric:        Mood and Affect: Affect normal.   Breast exam is performed in seated and lying down position. Patient is status post bilateral mastectomy.   No palpable mass on chest wall at bilateral mastectomy sites.  No palpable axillary lymphadenopathy.   LAB RESULTS: CBC    Component Value Date/Time   WBC 6.4 06/05/2021 1345   RBC 3.81 (L) 06/05/2021 1345   HGB 11.5 (L) 06/05/2021 1345   HGB 11.2 (L) 05/18/2014 1045   HCT 34.8 (L) 06/05/2021 1345   HCT 34.7 (L) 05/18/2014 1045   PLT 285 06/05/2021 1345   PLT 218 05/18/2014 1045   MCV 91.3 06/05/2021 1345   MCV 92 05/18/2014 1045   MCH 30.2 06/05/2021 1345   MCHC 33.0 06/05/2021 1345   RDW 14.5 06/05/2021 1345   RDW 12.9 05/18/2014 1045   LYMPHSABS 1.6  06/05/2021 1345   LYMPHSABS 1.5 05/18/2014 1045   MONOABS 0.5 06/05/2021 1345   MONOABS 0.3 05/18/2014 1045   EOSABS 0.2 06/05/2021 1345   EOSABS 0.1 05/18/2014 1045   BASOSABS 0.1 06/05/2021 1345   BASOSABS 0.0 05/18/2014 1045   CMP Latest Ref Rng & Units 06/05/2021 03/26/2020 04/05/2019  Glucose 70 - 99 mg/dL 103(H) 116(H) 108(H)  BUN 8 - 23 mg/dL _0 Creatinine 0.44 - 1.00 mg/dL 0.96 1.00 0.91  Sodium 135 - 145 mmol/L 138 137 137  Potassium 3.5 - 5.1 mmol/L 3.8 4.3 4.1  Chloride 98 - 111 mmol/L 101 103 103  CO2 22 - 32 mmol/L _1 Calcium 8.9 - 10.3 mg/dL 9.6 9.2 10.0  Total Protein 6.5 - 8.1 g/dL 7.7 7.8 7.6  Total Bilirubin 0.3 - 1.2 mg/dL 1.2 1.4(H) 1.2  Alkaline Phos 38 - 126 U/L 84 90 84  AST 15 - 41 U/L _2 ALT 0 - 44 U/L _3 Assessment and plan 1. History of breast cancer in female   2. BRCA gene mutation positive   3. At risk for osteoporosis   4. Normocytic anemia    History of right breast stage II triple negative breast cancer. History of left breast T2 N0 M0 ER negative, PR weakly positive and HER-2 negative cancer. BRCA 1 positive per Dr. Angie Fava note.-Report was not available to me. Previously underwent adjuvant chemotherapy. Labs are reviewed and discussed with patient. She is doing well clinically. Physical examination did not show signs of recurrence.  Patient prefers to continue follow up annually.   Recommend annual dermatology examination.  recommend patient to stay up-to-date for her age-appropriate screening including colonoscopy. No family history of pancreatic cancer, she does not meet criteria for pancreatic cancer screening.   #Anemia, normocytic.  Chronic, likely due to previous chemo.  Stable hemoglobin.  #History of bilateral oophectomy, at risk of osteoporosis.  Continue calcium and vitamin D supplementation.  Recommend bone density to be done every  2 years with primary care provider/GYN . Follow-up in 1  year Patient expressed understanding and was in agreement with this plan. She also understands that She can call clinic at any time with any questions, concerns, or complaints.  We spent sufficient time to discuss many aspect of care, questions were answered to patient's satisfaction.  Earlie Server, MD   06/05/2021 6:45 PM

## 2021-06-05 NOTE — Progress Notes (Signed)
Pt here for follow. No new concerns reports. Pt requests to have breast exam done, gown provided for pt.

## 2022-03-18 ENCOUNTER — Encounter: Payer: Self-pay | Admitting: *Deleted

## 2022-03-18 ENCOUNTER — Encounter: Payer: Medicare Other | Attending: Family Medicine | Admitting: *Deleted

## 2022-03-18 VITALS — BP 118/74 | Ht 66.0 in | Wt 171.3 lb

## 2022-03-18 DIAGNOSIS — Z713 Dietary counseling and surveillance: Secondary | ICD-10-CM | POA: Insufficient documentation

## 2022-03-18 DIAGNOSIS — E119 Type 2 diabetes mellitus without complications: Secondary | ICD-10-CM | POA: Insufficient documentation

## 2022-03-18 NOTE — Patient Instructions (Signed)
Exercise:  Continue program for    30  minutes   5  days a week  Eat 3 meals day,   1-2  snacks a day Space meals 4-6 hours apart Don't skip meals - eat 1 protein and 1 carbohydrate serving Continue to limit sweets/desserts and fried foods  Make an eye doctor appointment  Call if you want to attend Diabetes classes or have an appointment with the nurse or dietitian

## 2022-03-18 NOTE — Progress Notes (Signed)
Diabetes Self-Management Education  Visit Type: First/Initial  Appt. Start Time: 0830 Appt. End Time: 0935  03/18/2022  Morgan Flores, identified by name and date of birth, is a 71 y.o. female with a diagnosis of Diabetes: Type 2.   ASSESSMENT  Blood pressure 118/74, height '5\' 6"'$  (1.676 m), weight 171 lb 4.8 oz (77.7 kg). Body mass index is 27.65 kg/m.   Diabetes Self-Management Education - 03/18/22 0943       Visit Information   Visit Type First/Initial      Initial Visit   Diabetes Type Type 2    Date Diagnosed Sept 2023    Are you currently following a meal plan? Yes    What type of meal plan do you follow? "low carb since diabetes diagnosis"    Are you taking your medications as prescribed? Yes      Health Coping   How would you rate your overall health? Good      Psychosocial Assessment   Patient Belief/Attitude about Diabetes Afraid   "anxious, worried"   What is the hardest part about your diabetes right now, causing you the most concern, or is the most worrisome to you about your diabetes?   Making healty food and beverage choices    Self-care barriers None    Self-management support Doctor's office    Patient Concerns Nutrition/Meal planning;Glycemic Control;Monitoring;Weight Control;Healthy Lifestyle    Special Needs None    Preferred Learning Style Auditory;Visual;Hands on    [place] in progress    How often do you need to have someone help you when you read instructions, pamphlets, or other written materials from your doctor or pharmacy? 1 - Never    What is the last grade level you completed in school? 13      Pre-Education Assessment   Patient understands the diabetes disease and treatment process. Needs Instruction    Patient understands incorporating nutritional management into lifestyle. Needs Instruction    Patient undertands incorporating physical activity into lifestyle. Needs Instruction    Patient understands using  medications safely. Needs Instruction    Patient understands monitoring blood glucose, interpreting and using results Needs Instruction    Patient understands prevention, detection, and treatment of acute complications. Needs Instruction    Patient understands prevention, detection, and treatment of chronic complications. Needs Instruction    Patient understands how to develop strategies to address psychosocial issues. Needs Instruction    Patient understands how to develop strategies to promote health/change behavior. Needs Instruction      Complications   Last HgB A1C per patient/outside source 6.5 %   03/05/2022   How often do you check your blood sugar? Patient declines    Have you had a dilated eye exam in the past 12 months? No    Have you had a dental exam in the past 12 months? Yes    Are you checking your feet? Yes    How many days per week are you checking your feet? 1      Dietary Intake   Breakfast skipped prior to diagnosis - now eats wheat toast and cheese    Lunch pita with chicken salad, lettuce, spinach; fruit (strawberries, raspberries, apple), chips    Snack (afternoon) reports 2 snacks/day - nuts, Greek yogurt, fruit    Dinner chicken, beef, pork; potatoes, beans, rice, pasta, green beans, broccoli, squash, salads with lettuce, tomato, carrots, onions, peppers, blue cheese dressing    Beverage(s) water, diet soda  Activity / Exercise   Activity / Exercise Type Moderate (swimming / aerobic walking)    How many days per week do you exercise? 5    How many minutes per day do you exercise? 30    Total minutes per week of exercise 150      Patient Education   Previous Diabetes Education No    Disease Pathophysiology Definition of diabetes, type 1 and 2, and the diagnosis of diabetes;Factors that contribute to the development of diabetes;Explored patient's options for treatment of their diabetes    Healthy Eating Role of diet in the treatment of diabetes and the  relationship between the three main macronutrients and blood glucose level;Food label reading, portion sizes and measuring food.;Carbohydrate counting;Effects of alcohol on blood glucose and safety factors with consumption of alcohol.    Being Active Role of exercise on diabetes management, blood pressure control and cardiac health.    Monitoring Interpreting lab values - A1C, lipid, urine microalbumina.    Chronic complications Relationship between chronic complications and blood glucose control    Diabetes Stress and Support Identified and addressed patients feelings and concerns about diabetes      Individualized Goals (developed by patient)   Reducing Risk Other (comment)   improve blood sugars, prevent diabetes complications, lose weight, lead a healthier lifestyle, become more fit     Outcomes   Expected Outcomes Demonstrated interest in learning. Expect positive outcomes    Program Status Not Completed         Individualized Plan for Diabetes Self-Management Training:   Learning Objective:  Patient will have a greater understanding of diabetes self-management. Patient education plan is to attend individual and/or group sessions per assessed needs and concerns.   Plan:   Patient Instructions  Exercise:  Continue program for    30  minutes   5  days a week  Eat 3 meals day,   1-2  snacks a day Space meals 4-6 hours apart Don't skip meals - eat 1 protein and 1 carbohydrate serving Continue to limit sweets/desserts and fried foods  Make an eye doctor appointment  Call if you want to attend Diabetes classes or have an appointment with the nurse or dietitian  Expected Outcomes:  Demonstrated interest in learning. Expect positive outcomes  Education material provided:  General Meal Planning Guidelines Simple Meal Plan  If problems or questions, patient to contact team via:   Johny Drilling, RN, CCM, [place] [phone]  Future DSME appointment:  PRN

## 2022-06-05 ENCOUNTER — Other Ambulatory Visit: Payer: Medicare Other

## 2022-06-05 ENCOUNTER — Ambulatory Visit: Payer: Medicare Other | Admitting: Oncology
